# Patient Record
Sex: Male | Born: 1965 | Race: White | Hispanic: No | Marital: Single | Smoking: Current every day smoker
Health system: Southern US, Community
[De-identification: ages and names within clinical notes are randomized; demographics above are authoritative.]

## PROBLEM LIST (undated history)

## (undated) DIAGNOSIS — F329 Major depressive disorder, single episode, unspecified: Secondary | ICD-10-CM

## (undated) DIAGNOSIS — E109 Type 1 diabetes mellitus without complications: Secondary | ICD-10-CM

## (undated) DIAGNOSIS — E785 Hyperlipidemia, unspecified: Secondary | ICD-10-CM

## (undated) DIAGNOSIS — F32A Depression, unspecified: Secondary | ICD-10-CM

## (undated) HISTORY — DX: Hyperlipidemia, unspecified: E78.5

---

## 2005-07-30 ENCOUNTER — Ambulatory Visit (HOSPITAL_COMMUNITY): Admission: RE | Admit: 2005-07-30 | Discharge: 2005-07-30 | Payer: Self-pay | Admitting: Obstetrics and Gynecology

## 2006-06-24 ENCOUNTER — Emergency Department (HOSPITAL_COMMUNITY): Admission: EM | Admit: 2006-06-24 | Discharge: 2006-06-25 | Payer: Self-pay | Admitting: Emergency Medicine

## 2006-06-25 ENCOUNTER — Inpatient Hospital Stay (HOSPITAL_COMMUNITY): Admission: RE | Admit: 2006-06-25 | Discharge: 2006-07-01 | Payer: Self-pay | Admitting: Psychiatry

## 2006-06-25 ENCOUNTER — Ambulatory Visit: Payer: Self-pay | Admitting: Psychiatry

## 2006-09-22 ENCOUNTER — Emergency Department (HOSPITAL_COMMUNITY): Admission: EM | Admit: 2006-09-22 | Discharge: 2006-09-22 | Payer: Self-pay | Admitting: Emergency Medicine

## 2006-11-18 ENCOUNTER — Emergency Department (HOSPITAL_COMMUNITY): Admission: EM | Admit: 2006-11-18 | Discharge: 2006-11-18 | Payer: Self-pay | Admitting: Emergency Medicine

## 2007-02-06 ENCOUNTER — Emergency Department (HOSPITAL_COMMUNITY): Admission: EM | Admit: 2007-02-06 | Discharge: 2007-02-06 | Payer: Self-pay | Admitting: *Deleted

## 2007-09-25 ENCOUNTER — Emergency Department (HOSPITAL_COMMUNITY): Admission: EM | Admit: 2007-09-25 | Discharge: 2007-09-25 | Payer: Self-pay | Admitting: Emergency Medicine

## 2007-12-15 ENCOUNTER — Emergency Department (HOSPITAL_COMMUNITY): Admission: EM | Admit: 2007-12-15 | Discharge: 2007-12-15 | Payer: Self-pay | Admitting: Emergency Medicine

## 2008-03-16 ENCOUNTER — Emergency Department (HOSPITAL_COMMUNITY): Admission: EM | Admit: 2008-03-16 | Discharge: 2008-03-17 | Payer: Self-pay | Admitting: Emergency Medicine

## 2008-06-07 ENCOUNTER — Emergency Department (HOSPITAL_COMMUNITY): Admission: EM | Admit: 2008-06-07 | Discharge: 2008-06-07 | Payer: Self-pay | Admitting: Emergency Medicine

## 2008-07-04 ENCOUNTER — Emergency Department (HOSPITAL_COMMUNITY): Admission: EM | Admit: 2008-07-04 | Discharge: 2008-07-04 | Payer: Self-pay | Admitting: Emergency Medicine

## 2008-07-25 ENCOUNTER — Emergency Department (HOSPITAL_COMMUNITY): Admission: EM | Admit: 2008-07-25 | Discharge: 2008-07-25 | Payer: Self-pay | Admitting: Emergency Medicine

## 2008-09-06 ENCOUNTER — Emergency Department (HOSPITAL_COMMUNITY): Admission: EM | Admit: 2008-09-06 | Discharge: 2008-09-06 | Payer: Self-pay | Admitting: Emergency Medicine

## 2008-09-24 ENCOUNTER — Emergency Department (HOSPITAL_COMMUNITY): Admission: EM | Admit: 2008-09-24 | Discharge: 2008-09-24 | Payer: Self-pay | Admitting: Emergency Medicine

## 2008-12-01 ENCOUNTER — Emergency Department (HOSPITAL_COMMUNITY): Admission: EM | Admit: 2008-12-01 | Discharge: 2008-12-01 | Payer: Self-pay | Admitting: Emergency Medicine

## 2008-12-14 ENCOUNTER — Emergency Department (HOSPITAL_COMMUNITY): Admission: EM | Admit: 2008-12-14 | Discharge: 2008-12-14 | Payer: Self-pay | Admitting: Emergency Medicine

## 2008-12-31 ENCOUNTER — Emergency Department (HOSPITAL_COMMUNITY): Admission: EM | Admit: 2008-12-31 | Discharge: 2008-12-31 | Payer: Self-pay | Admitting: Cardiology

## 2009-01-14 ENCOUNTER — Emergency Department (HOSPITAL_COMMUNITY): Admission: EM | Admit: 2009-01-14 | Discharge: 2009-01-14 | Payer: Self-pay | Admitting: Family Medicine

## 2009-01-31 ENCOUNTER — Emergency Department (HOSPITAL_COMMUNITY): Admission: EM | Admit: 2009-01-31 | Discharge: 2009-01-31 | Payer: Self-pay | Admitting: Emergency Medicine

## 2009-02-12 ENCOUNTER — Emergency Department (HOSPITAL_COMMUNITY): Admission: EM | Admit: 2009-02-12 | Discharge: 2009-02-12 | Payer: Self-pay | Admitting: Emergency Medicine

## 2009-03-23 ENCOUNTER — Emergency Department (HOSPITAL_COMMUNITY): Admission: EM | Admit: 2009-03-23 | Discharge: 2009-03-23 | Payer: Self-pay | Admitting: Emergency Medicine

## 2009-05-27 ENCOUNTER — Ambulatory Visit: Payer: Self-pay | Admitting: Endocrinology

## 2009-07-16 ENCOUNTER — Emergency Department (HOSPITAL_COMMUNITY): Admission: EM | Admit: 2009-07-16 | Discharge: 2009-07-16 | Payer: Self-pay | Admitting: Emergency Medicine

## 2009-07-17 ENCOUNTER — Emergency Department (HOSPITAL_COMMUNITY): Admission: EM | Admit: 2009-07-17 | Discharge: 2009-07-17 | Payer: Self-pay | Admitting: Emergency Medicine

## 2009-09-11 ENCOUNTER — Emergency Department (HOSPITAL_COMMUNITY): Admission: EM | Admit: 2009-09-11 | Discharge: 2009-09-11 | Payer: Self-pay | Admitting: Emergency Medicine

## 2009-09-18 ENCOUNTER — Emergency Department (HOSPITAL_COMMUNITY): Admission: EM | Admit: 2009-09-18 | Discharge: 2009-09-18 | Payer: Self-pay | Admitting: Emergency Medicine

## 2009-11-09 ENCOUNTER — Emergency Department (HOSPITAL_COMMUNITY): Admission: EM | Admit: 2009-11-09 | Discharge: 2009-11-09 | Payer: Self-pay | Admitting: Emergency Medicine

## 2009-11-10 ENCOUNTER — Emergency Department (HOSPITAL_COMMUNITY): Admission: EM | Admit: 2009-11-10 | Discharge: 2009-11-11 | Payer: Self-pay | Admitting: Emergency Medicine

## 2010-03-30 ENCOUNTER — Emergency Department (HOSPITAL_COMMUNITY)
Admission: EM | Admit: 2010-03-30 | Discharge: 2010-03-30 | Payer: Self-pay | Source: Home / Self Care | Admitting: Family Medicine

## 2010-04-06 LAB — HIV ANTIBODY (ROUTINE TESTING W REFLEX): HIV: NONREACTIVE

## 2010-04-12 ENCOUNTER — Encounter: Payer: Self-pay | Admitting: Family Medicine

## 2010-05-26 ENCOUNTER — Emergency Department (HOSPITAL_COMMUNITY)
Admission: EM | Admit: 2010-05-26 | Discharge: 2010-05-26 | Disposition: A | Discharge status: Home / Self Care | Payer: Self-pay | Attending: Emergency Medicine | Admitting: Emergency Medicine

## 2010-05-26 DIAGNOSIS — M545 Low back pain, unspecified: Secondary | ICD-10-CM | POA: Insufficient documentation

## 2010-05-26 DIAGNOSIS — E119 Type 2 diabetes mellitus without complications: Secondary | ICD-10-CM | POA: Insufficient documentation

## 2010-05-26 DIAGNOSIS — Z794 Long term (current) use of insulin: Secondary | ICD-10-CM | POA: Insufficient documentation

## 2010-05-26 DIAGNOSIS — B49 Unspecified mycosis: Secondary | ICD-10-CM | POA: Insufficient documentation

## 2010-06-05 LAB — GLUCOSE, CAPILLARY: Glucose-Capillary: 165 mg/dL — ABNORMAL HIGH (ref 70–99)

## 2010-06-07 LAB — POCT RAPID STREP A (OFFICE): Streptococcus, Group A Screen (Direct): NEGATIVE

## 2010-06-07 LAB — GLUCOSE, CAPILLARY
Glucose-Capillary: 209 mg/dL — ABNORMAL HIGH (ref 70–99)
Glucose-Capillary: 192 mg/dL — ABNORMAL HIGH (ref 70–99)

## 2010-06-09 LAB — URINALYSIS, ROUTINE W REFLEX MICROSCOPIC
Bilirubin Urine: NEGATIVE
Glucose, UA: >1000 mg/dL — AB
Hgb urine dipstick: NEGATIVE
Ketones, ur: NEGATIVE mg/dL
Leukocytes, UA: NEGATIVE
Nitrite: NEGATIVE
Protein, ur: NEGATIVE mg/dL
Specific Gravity, Urine: 1.022 (ref 1.005–1.030)
Urobilinogen, UA: 0.2 mg/dL (ref 0.0–1.0)
pH: 6 (ref 5.0–8.0)

## 2010-06-09 LAB — URINE MICROSCOPIC-ADD ON

## 2010-06-09 LAB — GLUCOSE, CAPILLARY: Glucose-Capillary: 276 mg/dL — ABNORMAL HIGH (ref 70–99)

## 2010-06-17 ENCOUNTER — Emergency Department (HOSPITAL_COMMUNITY)
Admission: EM | Admit: 2010-06-17 | Discharge: 2010-06-18 | Disposition: A | Discharge status: Home / Self Care | Payer: Self-pay | Attending: Emergency Medicine | Admitting: Emergency Medicine

## 2010-06-17 DIAGNOSIS — M545 Low back pain, unspecified: Secondary | ICD-10-CM | POA: Insufficient documentation

## 2010-06-17 DIAGNOSIS — IMO0002 Reserved for concepts with insufficient information to code with codable children: Secondary | ICD-10-CM | POA: Insufficient documentation

## 2010-06-17 DIAGNOSIS — Z794 Long term (current) use of insulin: Secondary | ICD-10-CM | POA: Insufficient documentation

## 2010-06-17 DIAGNOSIS — F329 Major depressive disorder, single episode, unspecified: Secondary | ICD-10-CM | POA: Insufficient documentation

## 2010-06-17 DIAGNOSIS — G8929 Other chronic pain: Secondary | ICD-10-CM | POA: Insufficient documentation

## 2010-06-17 DIAGNOSIS — E109 Type 1 diabetes mellitus without complications: Secondary | ICD-10-CM | POA: Insufficient documentation

## 2010-06-17 DIAGNOSIS — F3289 Other specified depressive episodes: Secondary | ICD-10-CM | POA: Insufficient documentation

## 2010-06-26 LAB — GLUCOSE, CAPILLARY: Glucose-Capillary: 169 mg/dL — ABNORMAL HIGH (ref 70–99)

## 2010-06-28 LAB — URINALYSIS, ROUTINE W REFLEX MICROSCOPIC
Bilirubin Urine: NEGATIVE
Glucose, UA: >1000 mg/dL — AB
Hgb urine dipstick: NEGATIVE
Ketones, ur: NEGATIVE mg/dL
Leukocytes, UA: NEGATIVE
Nitrite: NEGATIVE
Protein, ur: NEGATIVE mg/dL
Specific Gravity, Urine: <1.005 — ABNORMAL LOW (ref 1.005–1.030)
Urobilinogen, UA: 0.2 mg/dL (ref 0.0–1.0)
pH: 5 (ref 5.0–8.0)

## 2010-06-28 LAB — BASIC METABOLIC PANEL
BUN: 9 mg/dL (ref 6–23)
CO2: 32 mEq/L (ref 19–32)
Calcium: 9.7 mg/dL (ref 8.4–10.5)
Chloride: 101 mEq/L (ref 96–112)
Creatinine, Ser: 0.81 mg/dL (ref 0.4–1.5)
GFR calc Af Amer: >60 mL/min (ref >60)
GFR calc non Af Amer: >60 mL/min (ref >60)
Glucose, Bld: 384 mg/dL — ABNORMAL HIGH (ref 70–99)
Potassium: 4.6 mEq/L (ref 3.5–5.1)
Sodium: 137 mEq/L (ref 135–145)

## 2010-06-28 LAB — CBC
HCT: 44 % (ref 39.0–52.0)
Hemoglobin: 15.9 g/dL (ref 13.0–17.0)
MCHC: 36.1 g/dL — ABNORMAL HIGH (ref 30.0–36.0)
MCV: 86.9 fL (ref 78.0–100.0)
Platelets: 183 10*3/uL (ref 150–400)
RBC: 5.06 MIL/uL (ref 4.22–5.81)
RDW: 12.2 % (ref 11.5–15.5)
WBC: 6 10*3/uL (ref 4.0–10.5)

## 2010-06-28 LAB — GLUCOSE, CAPILLARY
Glucose-Capillary: 280 mg/dL — ABNORMAL HIGH (ref 70–99)
Glucose-Capillary: 310 mg/dL — ABNORMAL HIGH (ref 70–99)
Glucose-Capillary: 322 mg/dL — ABNORMAL HIGH (ref 70–99)

## 2010-06-28 LAB — DIFFERENTIAL
Basophils Absolute: 0 10*3/uL (ref 0.0–0.1)
Basophils Relative: 0 % (ref 0–1)
Eosinophils Absolute: 0 10*3/uL (ref 0.0–0.7)
Eosinophils Relative: 0 % (ref 0–5)
Lymphocytes Relative: 33 % (ref 12–46)
Lymphs Abs: 2 10*3/uL (ref 0.7–4.0)
Monocytes Absolute: 0.4 10*3/uL (ref 0.1–1.0)
Monocytes Relative: 7 % (ref 3–12)
Neutro Abs: 3.6 10*3/uL (ref 1.7–7.7)
Neutrophils Relative %: 60 % (ref 43–77)

## 2010-06-28 LAB — URINE CULTURE: Colony Count: 1000

## 2010-06-28 LAB — URINE MICROSCOPIC-ADD ON: Urine-Other: NONE SEEN

## 2010-06-29 LAB — GLUCOSE, CAPILLARY: Glucose-Capillary: 364 mg/dL — ABNORMAL HIGH (ref 70–99)

## 2010-06-30 LAB — GLUCOSE, CAPILLARY: Glucose-Capillary: 127 mg/dL — ABNORMAL HIGH (ref 70–99)

## 2010-06-30 LAB — POCT I-STAT, CHEM 8
BUN: 14 mg/dL (ref 6–23)
Creatinine, Ser: 1 mg/dL (ref 0.4–1.5)
Hemoglobin: 16.3 g/dL (ref 13.0–17.0)
Potassium: 4.4 mEq/L (ref 3.5–5.1)
Sodium: 139 mEq/L (ref 135–145)
TCO2: 26 mmol/L (ref 0–100)

## 2010-06-30 LAB — CK TOTAL AND CKMB (NOT AT ARMC): CK, MB: 1 ng/mL (ref 0.3–4.0)

## 2010-07-02 LAB — GLUCOSE, CAPILLARY: Glucose-Capillary: 234 mg/dL — ABNORMAL HIGH (ref 70–99)

## 2010-08-04 NOTE — Consult Note (Signed)
NAMEELMO, RIO NO.:  [id number]   MEDICAL RECORD NO.:  [id number]          PATIENT TYPE:  EMS   LOCATION:  ED                            FACILITY:  [facility]   PHYSICIAN:  Osvaldo Shipper, MD     DATE OF BIRTH:  Aug 18, 1965   DATE OF CONSULTATION:  09/25/2007  DATE OF DISCHARGE:                                 CONSULTATION   The patient goes to the health department in [facility] for his  medical needs.  He also sees Dr. Betti Cruz, a psychiatrist.   REASON FOR CONSULTATION:  Abdominal pain.   PERSON REQUESTING CONSULTATION:  ED physician.   CHIEF COMPLAINT:  Abdominal pain since 5 p.m. yesterday.   HISTORY OF PRESENT ILLNESS:  The patient is a 45 year old Caucasian male  who has a past medical history of diabetes on insulin, depression,  anxiety disorder, fibromyalgia, who was in his usual state of health  until about 5 p.m. yesterday evening when he started experiencing  bilateral flank pain, and this started radiating to the front part of  his abdomen.  He had some nausea but did not have any emesis.  Denies  any diarrhea.  He had two bowel movements yesterday which were normal.  Denies any symptoms of acid reflux.  He states the pain is an 8/10 in  intensity.  He is moaning and groaning.  Denies any falls.  No dysuria.  No fever or chills.  No sick contacts.  No recent travel outside this  area.  The pain is described as a throbbing pain.  He denies any  surgeries in the past.   MEDICATIONS AT HOME:  1. Lantus insulin 30 units b.i.d.  2. NovoLog on a sliding scale.  3. Celexa 20 mg daily.  4. Klonopin 1 mg three times a day.   ALLERGIES:  No known drug allergies.   PAST MEDICAL HISTORY:  1. Anxiety disorder.  2. Depression.  3. Diabetes.  4. Fibromyalgia.  5. Neuropathy.   PAST SURGICAL HISTORY:  No surgical history.   No history of coronary artery disease.   SOCIAL HISTORY:  Lives with his sister.  Currently unemployed.  No  illicit drug  use.  No alcohol use.  Smokes a pack of cigarettes on a  daily basis.   FAMILY HISTORY:  Positive for diabetes, hypertension, dyslipidemia and  unknown cancer.   REVIEW OF SYSTEMS:  GENERAL:  Positive for weakness.  HEENT:  Unremarkable.  CARDIOVASCULAR:  Unremarkable. No Chest Pain.  RESPIRATORY:  Unremarkable. No difficulty with breathing. GI:  As in  HPI.  GU:  Unremarkable.  MUSCULOSKELETAL:  Unremarkable.  NEUROLOGIC:  Unremarkable.  PSYCHIATRIC:  Unremarkable.   PHYSICAL EXAMINATION:  VITAL SIGNS:  Temperature 97.8, blood pressure  125/76, heart rate 71, respiratory rate 18, saturation 100% on room air.  GENERAL:  A well-developed, well-nourished white male in no distress.  He appears to be moaning and groaning, but when he is talking, he is in  no discomfort whatsoever.  HEENT:  No pallor, no icterus.  Oral mucous membranes moist.  No oral  lesions.  NECK:  Soft and supple.  No thyromegaly is appreciated.  LUNGS:  Clear to auscultation bilaterally.  CARDIOVASCULAR:  S1 and S2 are normal, regular. No murmurs, s3 s4, rubs,  bruits.  ABDOMEN:  Soft, nontender, nondistended.  Bowel sounds are present.  No  organomegaly is appreciated.  No CVA tenderness is present.  No bruits  are heard.  No rebound tenderness is present anywhere.  There is mild  tenderness in the epigastric area, but, again, this is very minimal.  EXTREMITIES:  No edema.  Peripheral pulses are palpable.  NEUROLOGIC:  He is alert, oriented x3.  No focal neurological deficits  are present.   LABORATORY DATA:  CBC is unremarkable.  His CMET shows a glucose of 183;  otherwise completely normal.  Lipase is normal.  UA shows slightly  dilute urine with mild glucosuria.  No evidence of infection.  Hemoglobin is negative.   He had a CT scan of his abdomen and pelvis with contrast which did not  show any acute intra-abdominal process.  I also spoke to the  radiologist, and he mentioned that all the vasculature in the  abdomen  appeared to be patent.  No abdominal aortic aneurysm was noted.  Mild  bladder wall thickening was noted.  Incidentally, two 5 mm left lower  lobe pulmonary nodules were noted.   ASSESSMENT AND PLAN:  This is a 45 year old Caucasian male who has a  history of diabetes who presents with abdominal pain which began  yesterday evening.  His work-up has been completely unremarkable.  He  received multiple doses of Dilaudid, which, according to the patient,  have not caused him any relief from the pain.  We were called to  evaluate the patient.  I have reviewed the CT scan report and have also  reviewed the CT with the radiologist.  No concerning abnormalities  present.  The bladder wall thickening is non-specific.  With a negative  UA and with no symptom, I don't suspect a UTI.  The patient has been on  pain medications and methadone in the past, and according to him, these  have been discontinued ever since the primary medical doctor decided not  to prescribe it to him.  I am not sure if this is pain medication  seeking behavior.  At this point, I do not see any reason to admit this  patient to the hospital.  I will prescribe him 10 tablets of Percocet, a  4-week course of Prilosec.  Will provide him with a phone number for  gastroenterology if this persists.  He needs to have a soft, bland diet  for 5 days.  He needs to stop smoking.  He has also been told about his  lung findings, and I have explained to him that he needs to get a  CT scan in 6 months time to evaluate his pulmonary nodules.  He may  continue his other home medications at home as before.  If his pain does  not improve in the next few day, he may return to the ED for further  evaluation.  The above was also communicated to the ED physician, Dr.  Margretta Ditty.      Osvaldo Shipper, MD  Electronically Signed     GK/MEDQ  D:  09/25/2007  T:  09/25/2007  Job:  [id number]   cc:   [facility]

## 2010-08-07 NOTE — Discharge Summary (Signed)
NAMEADRIENNE, DELAY NO.:  [id number]   MEDICAL RECORD NO.:  [id number]          PATIENT TYPE:  IPS   LOCATION:  0500                          FACILITY:  [facility]   PHYSICIAN:  Geoffery Lyons, M.D.      DATE OF BIRTH:  08/26/1965   DATE OF ADMISSION:  06/25/2006  DATE OF DISCHARGE:  07/01/2006                               DISCHARGE SUMMARY   CHIEF COMPLAINT AND PRESENT ILLNESS:  This was the first admission to  [facility] for this 45 year old single white male.  Presented to the emergency room at [facility].  Endorsed he has been  depressed, wakes up every morning wanting to kill himself, lost his job  in January, having crying spells, difficulty concentrating, memory loss.  At the time of this admission, he felt suicidal, had a plan to jump off  a bridge, unable to pay to see a doctor to get his medications.  No  energy, no initiative, neglecting his pet dog, feels like he is in a rut  and just cannot get out.  He has been employed as an Personnel officer.  He  developed MRSA.  Due to the treatment, he claimed he did not feel good  and eventually lost his job.  No income at all.  Thought that he could  get a job easily.   PAST PSYCHIATRIC HISTORY:  He was at [facility] due to substance  abuse.   MEDICAL HISTORY:  Diabetes, fibromyalgia, hyperlipidemia, peripheral  neuropathy and MRSA.   MEDICATIONS:  Methadone 100 mg per day, Xanax 1 mg, 1-2 every day,  buying off the street, Klonopin prescribed, using it to help sleep the  last two years, insulin 70/30.   PHYSICAL EXAMINATION:  Performed and failed to show any acute findings.   LABORATORY DATA:  SGOT 15, SGPT 27, glucose 305.  UDS positive for  benzodiazepine.   MENTAL STATUS EXAM:  Alert, cooperative male casually groomed and  dressed.  Speech is normal in rate, tempo and production.  Mood is  depressed.  Affect is congruent.  Thought processes are clear, rational  and goal-oriented.  Would  not want to get on any medication that he  could not keep upon discharge.  Has no resources.  Denies any suicidal  or homicidal ideation.  No evidence of delusions.  Cognition well-  preserved.   ADMISSION DIAGNOSES:  AXIS I:  Major depressive disorder.  AXIS II:  No diagnosis.  AXIS III:  Fibromyalgia, diabetes mellitus, hyperlipidemia, peripheral  neuropathy, MRSA, by history.  AXIS IV:  Moderate.  AXIS V:  GAF upon admission 25-30; highest GAF in the last year 60.   HOSPITAL COURSE:  He was admitted and started in individual and group  psychotherapy.  He was detoxed with Librium as he was taken off the  benzodiazepines.  He was placed on Cymbalta 30 mg per day and kept on  the methadone.  His father died in 2003-08-03.  This was very stressful for  him.  Endorsed that, about a year ago, sleep changed but using Klonopin.  Before that, used to be  outgoing, then he started to be more to himself,  lost his job due to MRSA, abscess in his neck, as already stated, goes  to bed at 2 in the morning, cannot sleep, feeling terrible.  Has  diabetes.  Got to a point that he wanted to hurt himself.  Had been in  [place] six years ago.  Addicted to Xanax.  Has been on Lexapro and then  switched to Prozac.  Was on OxyContin for fibromyalgia and he was  switched to methadone for withdrawal.  By June 28, 2006, he was dealing  with anxiety and also the depression.  Mood worse in the last four  months.  Did the best on Lexapro, __________.  Using benzodiazepines on  his own, high doses of opiates.  Claimed he was suicidal on Wellbutrin.  No energy, no motivation, no desire to do things.  Did say cocaine made  him feel well and not high, just well.  Says he wanted to quit self-  medicating.  Wanting to do better.  Was on Effexor with side effects,  Cymbalta with no benefit.  Agreed to try Paxil and says things  differently.  Committed to abstinence to do the right thing.  Worried  about finding a job but did  admit that, if he abstained, he is going to  be more clear-headed.  We pursued the Paxil, coping skills, relapse  prevention.  As we detoxed, became pretty anxious as Librium was  discontinued.  A lot of uncertainty, worries, concerns.  Did not sleep  due to worrying.  By July 01, 2006, he was not suicidal.  He was  worried about relapse risk but had a positive plan of action.  Was going  to pursue recovery.  There were no active suicidal or homicidal  ideation, no hallucinations, no delusions.  He was insightful.  He was  going to continued to work with himself, seek counseling and maintain  the medication.   DISCHARGE DIAGNOSES:  AXIS I:  Major depressive disorder.  Anxiety  disorder not otherwise specified.  Benzodiazepine, opiate and cocaine  abuse.  AXIS II:  No diagnosis.  AXIS III:  Fibromyalgia, diabetes, hyperlipidemia, peripheral  neuropathy, history of MRSA.  AXIS IV:  Moderate.  AXIS V:  GAF upon discharge 50-55.   DISCHARGE MEDICATIONS:  1. Insulin NovoLog mix 70/30 40 units in the morning.  2. Methadone 10 mg per day.  3. Paxil CR 25 mg per day.  4. Trazodone 50 mg at bedtime.   FOLLOWUP:  Dr. Sudie Bailey in [facility].      Geoffery Lyons, M.D.  Electronically Signed     IL/MEDQ  D:  07/28/2006  T:  07/29/2006  Job:  [id number]

## 2010-08-07 NOTE — H&P (Signed)
Justin Moreno, Justin Moreno NO.:  [id number]   MEDICAL RECORD NO.:  [id number]          PATIENT TYPE:  IPS   LOCATION:  0500                          FACILITY:  [facility]   PHYSICIAN:  Justin Moreno, M.D.      DATE OF BIRTH:  January 04, 1966   DATE OF ADMISSION:  06/25/2006  DATE OF DISCHARGE:                       PSYCHIATRIC ADMISSION ASSESSMENT   IDENTIFYING INFORMATION:  This is a voluntary admission to the services  of Justin Moreno.  This is a 45 year old single white male.  He  presented to the ED at [facility].  He reported that he has been  depressed.  He wakes up every morning wanting to kill himself.  He lost  his job in January.  He notices that he is having crying spells,  difficulty concentrating, memory loss.  Today, he felt suicidal.  He  actually had a plan to jump off a bridge.  He is unable to pay to see a  doctor to get his medications.  He has no energy, no initiative.  He is  neglecting his pet dog.  He feels like he is in a rut and he just cannot  get out.  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  Back in November, it developed that these boils that kept  coming up on him actually were MRSA.  Due to medication that he had to  take, he just did not feel good.  He eventually lost his job in January.  He only recently has applied for unemployment.  He has no income at all  and he kept thinking he would get another job.  He is also treated for  fibromyalgia.   PAST PSYCHIATRIC HISTORY:  In 1990, he was at [facility] for substance  abuse.   SOCIAL HISTORY:  He is a high Garment/textile technologist in 1985.  He is never  married.  He is employed as an Personnel officer.  He does have a girlfriend  and currently he has been unemployed with no income since January.   FAMILY HISTORY:  His mom, sisters and brother all have anxiety.   ALCOHOL/DRUG HISTORY:  He continues to smoke 1-1/2 to 2 packs of  cigarettes per day.  He has been clean and sober from  drugs and alcohol  for about five years.   PRIMARY CARE PHYSICIAN:  Justin Moreno.   MEDICAL PROBLEMS:  He has diabetes, fibromyalgia, hyperlipidemia,  peripheral neuropathy and MRSA.  He apparently had positive cultures  from his right posterior arm and left clavicle.  However, they are not  active at this time.   MEDICATIONS:  He is prescribed methadone 10 mg p.o. q.d., Xanax 1 mg, 1-  2 q.d., that he has been buying off the street.  He had been prescribed  Klonopin by Justin Moreno and he has been using that to help him go to  sleep for the past two years.  Prior to losing his job, his diabetes was  well-controlled with Lantus and NovoLog.  He is now using insulin 70/30.  I do not have the exact dosages.  ALLERGIES:  No known drug allergies.   POSITIVE PHYSICAL FINDINGS:  His exam was completed at [facility] and he  had no remarkable findings.   LABORATORY DATA:  His glucose was 305.  His UDS was positive for  benzodiazepines and his alcohol level was 1.   MENTAL STATUS EXAM:  Today, he is alert and oriented x3.  He is casually  but appropriately groomed, dressed.  He does not appear to have been  malnourished.  His speech is not pressured.  His mood is depressed.  His  affect is congruent.  Thought processes are clear, rational and goal-  oriented.  He does not want to get on anything that he will not be able  to continue once discharged.  Judgment and insight are intact.  Concentration and memory are intact.  Intelligence is at least average.  He denies being suicidal or homicidal.  He denies auditory or visual  hallucinations.   DIAGNOSES:  AXIS I:  Major depressive disorder.  AXIS II:  Deferred.  AXIS III:  Fibromyalgia, diabetes, hyperlipidemia, peripheral neuropathy  and history for MRSA.  AXIS IV:  Severe.  AXIS V:  20.   PLAN:  To admit for safety and stabilization.  He was put on the low-  dose Librium protocol for detoxification purposes.  However, his  methadone  10 mg will be continued.  He is also on the sliding scale  insulin for glycemic control.  He is on the moderate protocol and we  will be starting some Cymbalta.  He states he has taken this in the past  but I feel about the medication he will be able to continue once  discharged.      Justin Moreno, P.A.-C.      Justin Moreno, M.D.  Electronically Signed    MD/MEDQ  D:  06/26/2006  T:  06/26/2006  Job:  [id number]

## 2010-10-29 ENCOUNTER — Encounter: Payer: Self-pay | Admitting: *Deleted

## 2010-10-29 ENCOUNTER — Emergency Department (HOSPITAL_COMMUNITY)
Admission: EM | Admit: 2010-10-29 | Discharge: 2010-10-30 | Disposition: A | Discharge status: Home / Self Care | Payer: Self-pay | Attending: Emergency Medicine | Admitting: Emergency Medicine

## 2010-10-29 DIAGNOSIS — M549 Dorsalgia, unspecified: Secondary | ICD-10-CM | POA: Insufficient documentation

## 2010-10-29 DIAGNOSIS — R5381 Other malaise: Secondary | ICD-10-CM | POA: Insufficient documentation

## 2010-10-29 DIAGNOSIS — E119 Type 2 diabetes mellitus without complications: Secondary | ICD-10-CM | POA: Insufficient documentation

## 2010-10-29 DIAGNOSIS — R109 Unspecified abdominal pain: Secondary | ICD-10-CM | POA: Insufficient documentation

## 2010-10-29 DIAGNOSIS — F17200 Nicotine dependence, unspecified, uncomplicated: Secondary | ICD-10-CM | POA: Insufficient documentation

## 2010-10-29 HISTORY — DX: Type 1 diabetes mellitus without complications: E10.9

## 2010-10-29 NOTE — ED Notes (Signed)
Pt c/o lower back pain and bilateral abd pain x 3 days; pt states he has been nauseous, denies any v/d

## 2010-10-30 ENCOUNTER — Emergency Department (HOSPITAL_COMMUNITY): Payer: Self-pay

## 2010-10-30 LAB — POCT I-STAT, CHEM 8
Creatinine, Ser: 1 mg/dL (ref 0.50–1.35)
Hemoglobin: 15.3 g/dL (ref 13.0–17.0)
Potassium: 3.9 mEq/L (ref 3.5–5.1)
Sodium: 142 mEq/L (ref 135–145)
TCO2: 30 mmol/L (ref 0–100)

## 2010-10-30 LAB — GLUCOSE, CAPILLARY: Glucose-Capillary: 139 mg/dL — ABNORMAL HIGH (ref 70–99)

## 2010-10-30 MED ORDER — ONDANSETRON HCL 4 MG PO TABS
4.0000 mg | ORAL_TABLET | Freq: Once | ORAL | Status: AC
  Administered 2010-10-30: 4 mg via ORAL
  Filled 2010-10-30: qty 1

## 2010-10-30 MED ORDER — HYDROCODONE-ACETAMINOPHEN 5-325 MG PO TABS
2.0000 | ORAL_TABLET | Freq: Once | ORAL | Status: AC
  Administered 2010-10-30: 2 via ORAL
  Filled 2010-10-30: qty 2

## 2010-10-30 MED ORDER — IBUPROFEN 800 MG PO TABS
800.0000 mg | ORAL_TABLET | Freq: Once | ORAL | Status: AC
  Administered 2010-10-30: 800 mg via ORAL
  Filled 2010-10-30: qty 1

## 2010-10-30 MED ORDER — CYCLOBENZAPRINE HCL 10 MG PO TABS: 10.0000 mg | ORAL_TABLET | Freq: Two times a day (BID) | ORAL | Status: AC | PRN

## 2010-10-30 MED ORDER — IBUPROFEN 800 MG PO TABS: 800.0000 mg | ORAL_TABLET | Freq: Three times a day (TID) | ORAL | Status: AC

## 2010-10-30 MED ORDER — HYDROCODONE-ACETAMINOPHEN 5-325 MG PO TABS: 1.0000 | ORAL_TABLET | ORAL | Status: AC | PRN

## 2010-10-30 MED ORDER — CYCLOBENZAPRINE HCL 10 MG PO TABS
10.0000 mg | ORAL_TABLET | Freq: Once | ORAL | Status: AC
  Administered 2010-10-30: 10 mg via ORAL
  Filled 2010-10-30: qty 1

## 2010-10-30 NOTE — ED Notes (Signed)
Patient given a sandwich and soda for blood sugar of 69; patient c/o feeling weak and shaky.  Dr. Colon Branch notified and ok to have food.

## 2010-10-30 NOTE — ED Notes (Signed)
Discharge instructions given and reviewed with patient.  Prescriptions given for Hydrocodone, Flexeril, and Ibuprofen; effects and use explained.  Patient verbalized understanding of sedating effects of medication.  Patient ambulatory with steady gait.  Discharged home in good condition.

## 2010-10-30 NOTE — ED Provider Notes (Signed)
History     CSN: [id number] Arrival date & time: 10/29/2010  9:29 PM  Chief Complaint  Patient presents with  . Back Pain  . Abdominal Pain   HPI Comments: Seen 2330  Patient is a 45 y.o. male presenting with back pain and abdominal pain. The history is provided by the patient.  Back Pain  This is a recurrent problem. Episode onset: Patient injured his back 3 months ago and it has hurt intermittantly since then. In the last two weeks pain to the lower back has gotten worse with some radiation bilaterally down the legs. The pain is associated with lifting heavy objects. The pain is present in the lumbar spine. The quality of the pain is described as aching. The pain radiates to the left thigh and right thigh. The pain is moderate. The symptoms are aggravated by bending and certain positions. The pain is worse during the night. Associated symptoms include abdominal pain and weakness. Pertinent negatives include no chest pain, no fever, no numbness, no weight loss, no headaches, no bladder incontinence, no dysuria, no pelvic pain, no paresthesias and no tingling. He has tried NSAIDs for the symptoms. The treatment provided no relief.  Abdominal Pain The primary symptoms of the illness include abdominal pain. The primary symptoms of the illness do not include fever or dysuria.  Additional symptoms associated with the illness include back pain.    Past Medical History  Diagnosis Date  . Juvenile diabetes     x 30 years    History reviewed. No pertinent past surgical history.  History reviewed. No pertinent family history.  History  Substance Use Topics  . Smoking status: Current Everyday Smoker -- 1.0 packs/day  . Smokeless tobacco: Not on file  . Alcohol Use:       Review of Systems  Constitutional: Negative for fever and weight loss.  Cardiovascular: Negative for chest pain.  Gastrointestinal: Positive for abdominal pain.  Genitourinary: Negative for bladder incontinence,  dysuria and pelvic pain.  Musculoskeletal: Positive for back pain.  Neurological: Positive for weakness. Negative for tingling, numbness, headaches and paresthesias.  All other systems reviewed and are negative.    Physical Exam  BP 142/75  Pulse 72  Temp(Src) 98 F (36.7 C) (Oral)  Resp 18  Ht 6\' 1"  (1.854 m)  Wt 220 lb (99.791 kg)  BMI 29.03 kg/m2  SpO2 97%  Physical Exam  Nursing note and vitals reviewed. Constitutional: He appears well-developed and well-nourished. No distress.  HENT:  Head: Normocephalic and atraumatic.  Eyes: EOM are normal.  Neck: Normal range of motion. Neck supple.  Cardiovascular: Normal rate and normal heart sounds.   Pulmonary/Chest: Effort normal and breath sounds normal.  Abdominal: Soft. Bowel sounds are normal. He exhibits no distension. There is no tenderness. There is no rebound and no guarding.  Musculoskeletal: Normal range of motion.       No spinal tenderness. Lumbar paraspinal  Tenderness to palpation. Able to bend over and touch toes, Lateral movement caused increased discomfort.  Skin: Skin is warm and dry.    ED Course  Procedures  MDM Reviewed xray reports. Istat results with glucose of 69. Fed patient snack and drink. Improvement in glucose to 136. Analgesic helped minimally. Will send home with Rx analgesic, muscle relaxant, antiinflammatory and referral to orthopedist, Dr. Hilda Lias.  MDM Interpretation: labs and x-ray   Reviewed nurse notes and vital signs.    Nicoletta Dress. Colon Branch, MD 10/30/10 [phone]

## 2010-10-30 NOTE — ED Notes (Signed)
Pt self ambulated with a steady and purposeful gait out side and back to smoke no noted distress

## 2010-10-30 NOTE — ED Notes (Signed)
CBG completed after patient ate sandwich; result of 139.  Dr. Colon Branch at bedside to discuss results and disposition with patient.

## 2010-12-17 LAB — COMPREHENSIVE METABOLIC PANEL
ALT: 39
AST: 21
Albumin: 3.7
Alkaline Phosphatase: 79
BUN: 8
Chloride: 99
GFR calc Af Amer: >60
Potassium: 4.4
Sodium: 136
Total Protein: 6.3

## 2010-12-17 LAB — URINALYSIS, ROUTINE W REFLEX MICROSCOPIC
Ketones, ur: NEGATIVE
Nitrite: NEGATIVE
Protein, ur: NEGATIVE
Urobilinogen, UA: 0.2

## 2010-12-17 LAB — DIFFERENTIAL
Basophils Relative: 1
Eosinophils Relative: 0
Monocytes Absolute: 0.3
Monocytes Relative: 4
Neutro Abs: 6.3

## 2010-12-17 LAB — CBC
Platelets: 227
RDW: 13.7
WBC: 7.9

## 2010-12-21 LAB — BASIC METABOLIC PANEL
BUN: 8
CO2: 27
Chloride: 98
Potassium: 4.2

## 2010-12-21 LAB — RAPID URINE DRUG SCREEN, HOSP PERFORMED
Amphetamines: NOT DETECTED
Barbiturates: NOT DETECTED

## 2010-12-21 LAB — SALICYLATE LEVEL: Salicylate Lvl: <4

## 2010-12-21 LAB — ETHANOL: Alcohol, Ethyl (B): <5

## 2011-01-05 LAB — BASIC METABOLIC PANEL
CO2: 29
Calcium: 9.6
GFR calc Af Amer: >60
GFR calc non Af Amer: >60
Sodium: 136

## 2011-01-05 LAB — RAPID URINE DRUG SCREEN, HOSP PERFORMED
Amphetamines: NOT DETECTED
Benzodiazepines: POSITIVE — AB
Cocaine: NOT DETECTED
Opiates: POSITIVE — AB
Tetrahydrocannabinol: NOT DETECTED

## 2011-01-05 LAB — CBC
Hemoglobin: 16.3
MCHC: 35.2
RBC: 5.47
WBC: 11 — ABNORMAL HIGH

## 2011-01-05 LAB — DIFFERENTIAL
Lymphocytes Relative: 30
Lymphs Abs: 3.3
Monocytes Absolute: 0.6
Monocytes Relative: 5
Neutro Abs: 7.1

## 2011-01-05 LAB — ETHANOL: Alcohol, Ethyl (B): <5

## 2011-01-31 IMAGING — CT CT ABDOMEN W/O CM
2 of 4 series · 16 of 46 positions shown, 18 images · IV contrast (agent unspecified)
Comparison: 09/25/2007

CT ABDOMEN:

CLINICAL DATA: Bilateral flank pain, urinary frequency, urinary
retention, diabetes

CT ABDOMEN AND PELVIS WITHOUT CONTRAST:
TECHNIQUE: Multidetector helical CT imaging of the abdomen and
pelvis were performed using kidney stone protocol.  Neither oral
nor intravenous contrast utilized for this indication.  Sagittal
and coronal MPR images reconstructed from axial data set.

[Series 2: stone under 45 under 200 wo 5.0m · axial · 0.71mm/px · z∈[+610,+1045]mm · 13 of 99 slices shown, 15 images]
[im 8/99  soft-tissue]
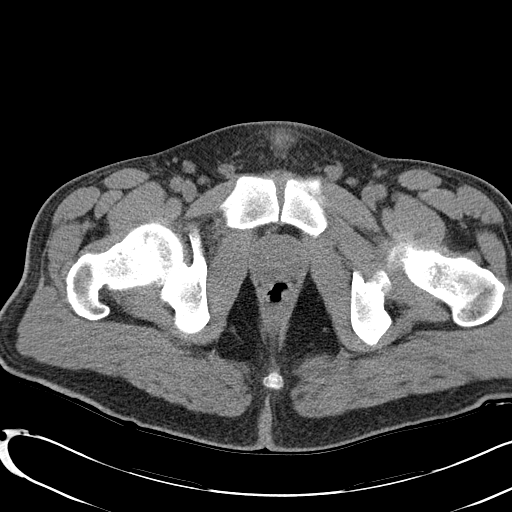
[im 8/99  bone]
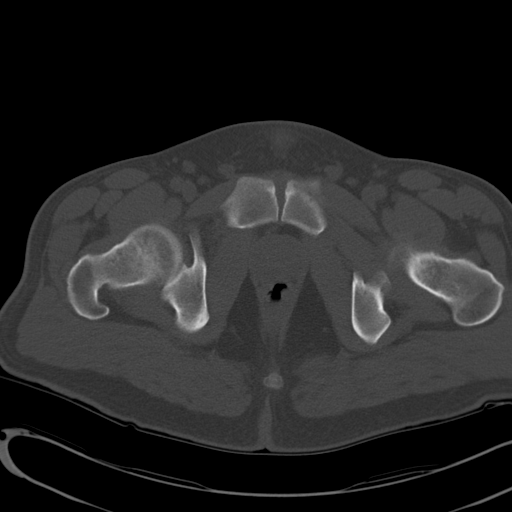
[im 15/99  soft-tissue]
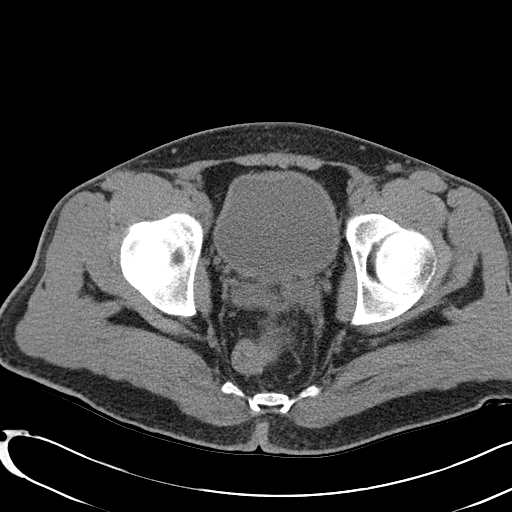
[im 22/99  soft-tissue]
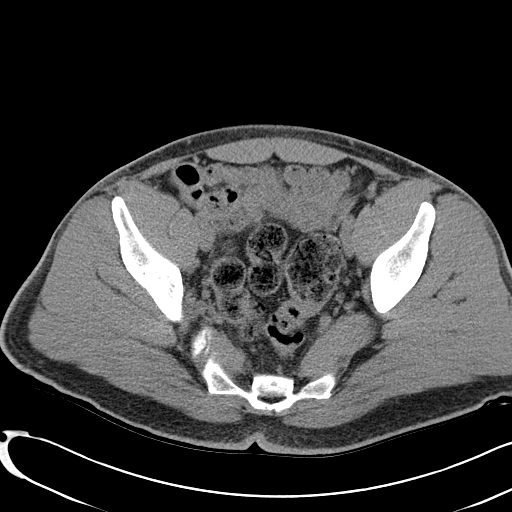
[im 30/99  soft-tissue]
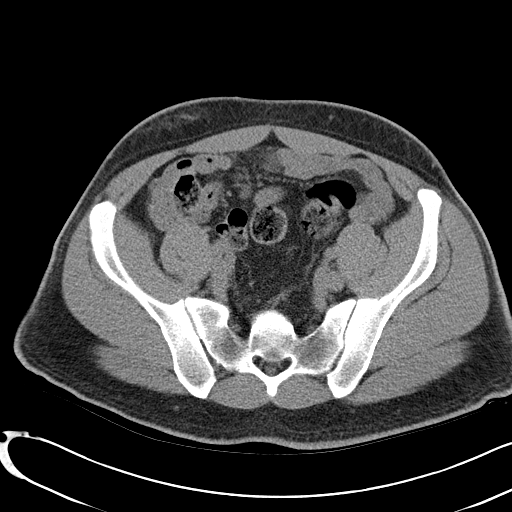
[im 37/99  soft-tissue]
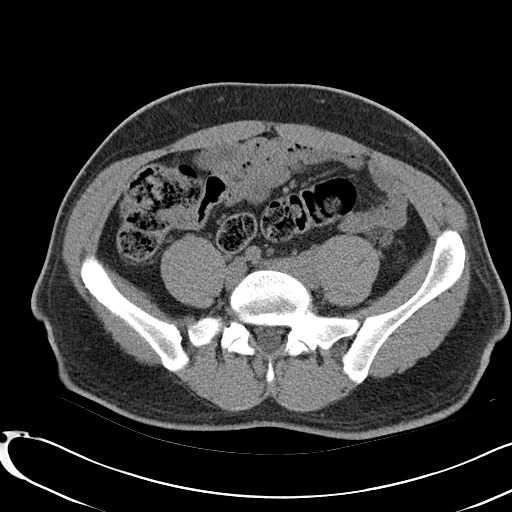
[im 44/99  soft-tissue]
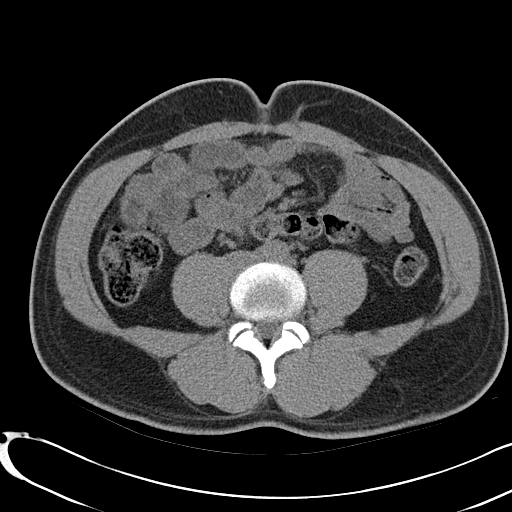
[im 51/99  soft-tissue]
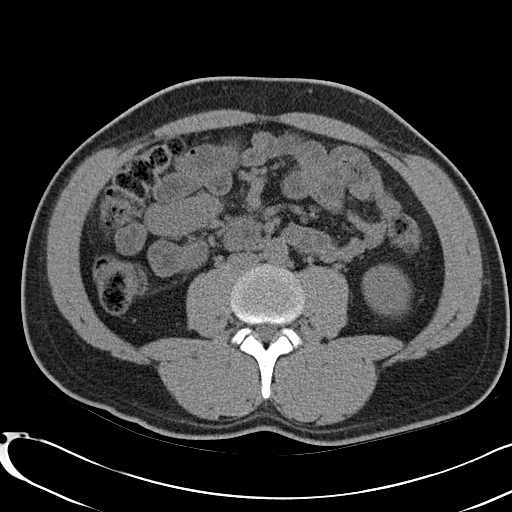
[im 59/99  soft-tissue]
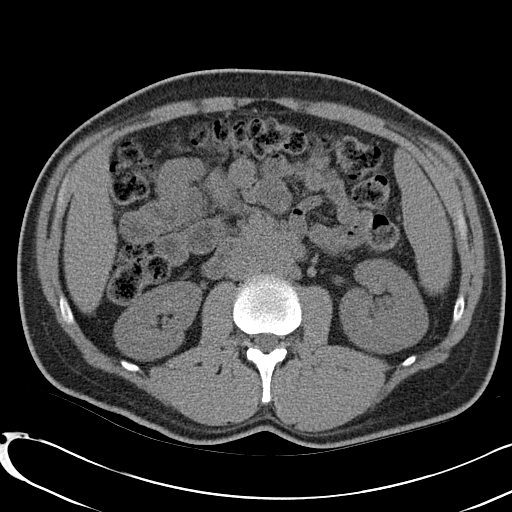
[im 66/99  soft-tissue]
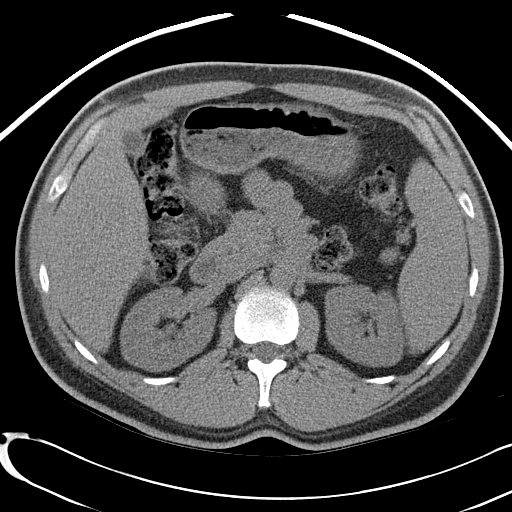
[im 66/99  bone]
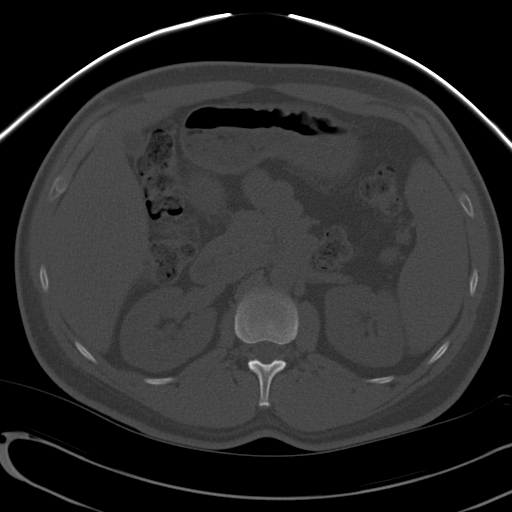
[im 73/99  soft-tissue]
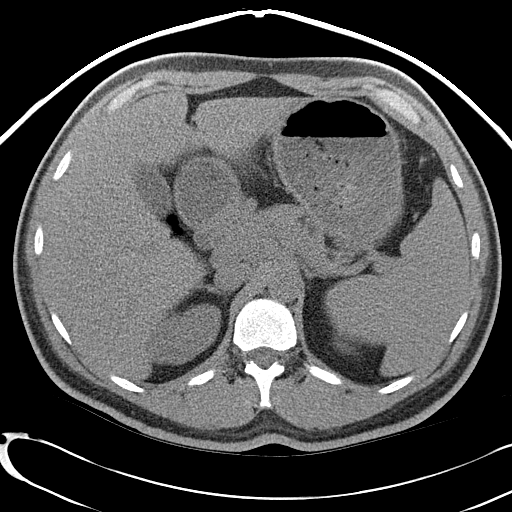
[im 80/99  soft-tissue]
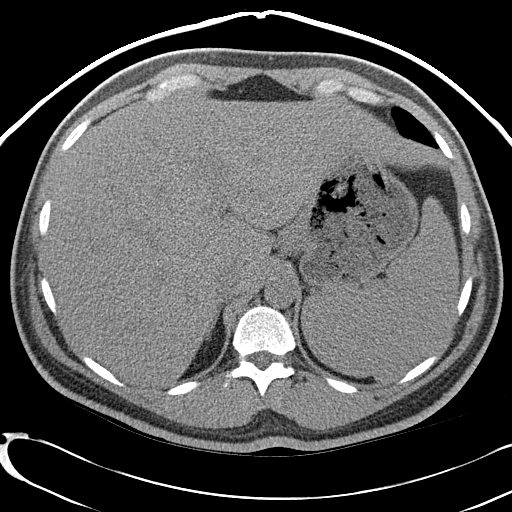
[im 88/99  soft-tissue]
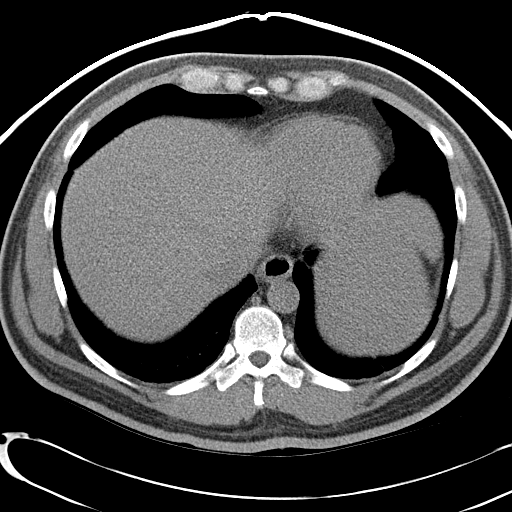
[im 95/99  soft-tissue]
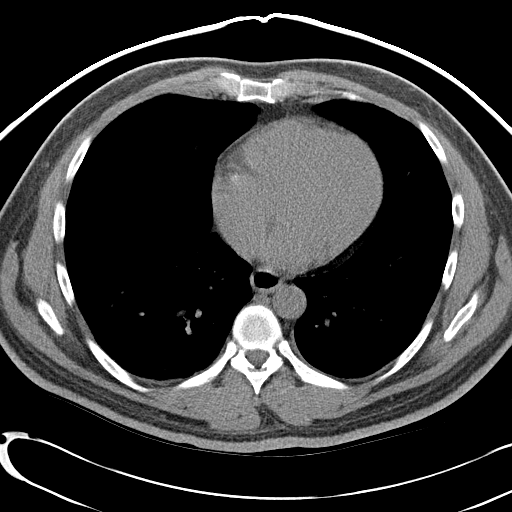

[Series 4: mpr coronal (id) · coronal · 0.75mm/px · 3 of 91 slices shown]
[im 31/91  soft-tissue]
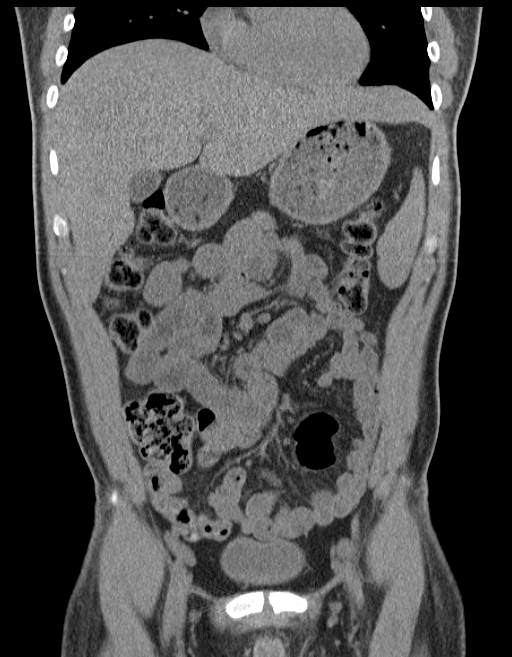
[im 41/91  soft-tissue]
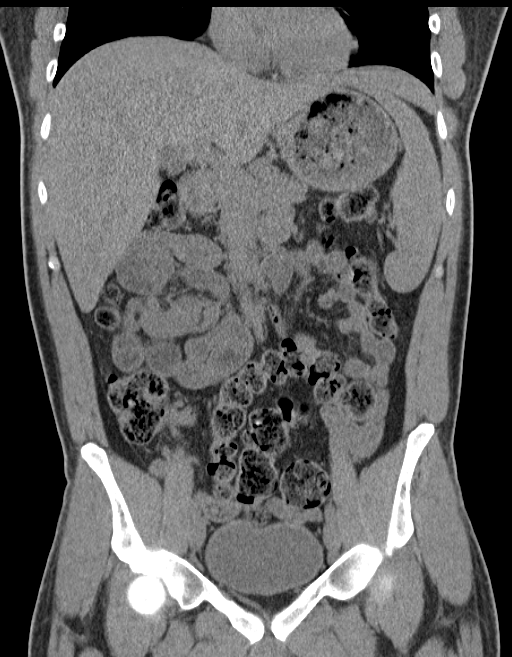
[im 51/91  soft-tissue]
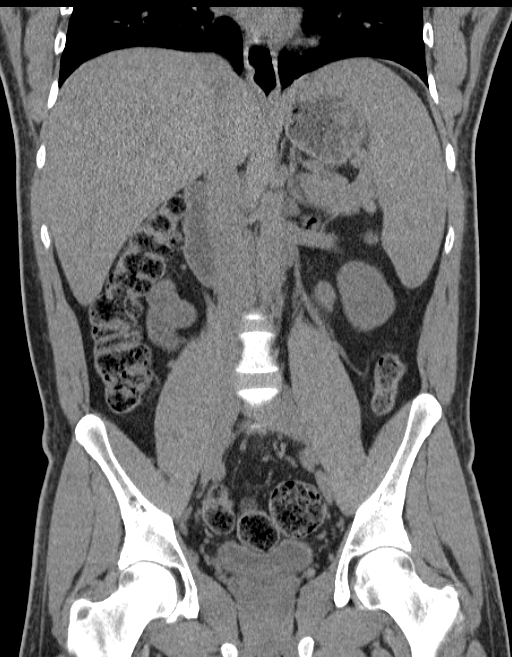

[16 of 46 positions shown; findings below may reference images not displayed]

FINDINGS: Dependent atelectasis at lung bases.
Tiny nonspecific low attenuation focus lateral aspect dome right
lobe liver, 9 mm image 10 stable.
Low attenuation lesion mid left kidney, poorly defined but
corresponding to a cyst present on prior CT.
No urinary tract calcification dilatation.
Solid organs and bowel loops in upper abdomen unremarkable for exam
lacking IV and oral contrast.
Probable splenule at splenic hilum, stable.
No mass, adenopathy, or free fluid.
IMPRESSION: Probable left renal cyst.
No acute upper abdominal abnormalities.

CT PELVIS:
FINDINGS: Pelvic bowel loops grossly unremarkable.
Bladder and distal ureters normal appearance.
No pelvic mass, adenopathy, or free fluid.
Bones unremarkable.
IMPRESSION: No acute intrapelvic abnormalities.

## 2011-08-01 ENCOUNTER — Encounter (HOSPITAL_COMMUNITY): Payer: Self-pay | Admitting: *Deleted

## 2011-08-01 ENCOUNTER — Emergency Department (HOSPITAL_COMMUNITY)
Admission: EM | Admit: 2011-08-01 | Discharge: 2011-08-02 | Disposition: A | Discharge status: Home / Self Care | Payer: Self-pay | Attending: Emergency Medicine | Admitting: Emergency Medicine

## 2011-08-01 DIAGNOSIS — M25559 Pain in unspecified hip: Secondary | ICD-10-CM | POA: Insufficient documentation

## 2011-08-01 DIAGNOSIS — R079 Chest pain, unspecified: Secondary | ICD-10-CM | POA: Insufficient documentation

## 2011-08-01 DIAGNOSIS — R1013 Epigastric pain: Secondary | ICD-10-CM | POA: Insufficient documentation

## 2011-08-01 DIAGNOSIS — R109 Unspecified abdominal pain: Secondary | ICD-10-CM | POA: Insufficient documentation

## 2011-08-01 DIAGNOSIS — M549 Dorsalgia, unspecified: Secondary | ICD-10-CM | POA: Insufficient documentation

## 2011-08-01 LAB — CBC
MCHC: 35.4 g/dL (ref 30.0–36.0)
Platelets: 177 10*3/uL (ref 150–400)
RDW: 13 % (ref 11.5–15.5)
WBC: 8.3 10*3/uL (ref 4.0–10.5)

## 2011-08-01 LAB — COMPREHENSIVE METABOLIC PANEL
ALT: 40 U/L (ref 0–53)
AST: 23 U/L (ref 0–37)
Albumin: 4.2 g/dL (ref 3.5–5.2)
CO2: 26 mEq/L (ref 19–32)
Calcium: 9.9 mg/dL (ref 8.4–10.5)
Chloride: 104 mEq/L (ref 96–112)
Creatinine, Ser: 0.76 mg/dL (ref 0.50–1.35)
GFR calc non Af Amer: >90 mL/min (ref >90)
Sodium: 142 mEq/L (ref 135–145)

## 2011-08-01 LAB — DIFFERENTIAL
Basophils Absolute: 0 10*3/uL (ref 0.0–0.1)
Basophils Relative: 0 % (ref 0–1)
Lymphocytes Relative: 32 % (ref 12–46)
Neutro Abs: 5.2 10*3/uL (ref 1.7–7.7)

## 2011-08-01 MED ORDER — OXYCODONE-ACETAMINOPHEN 5-325 MG PO TABS
2.0000 | ORAL_TABLET | Freq: Once | ORAL | Status: AC
  Administered 2011-08-01: 2 via ORAL
  Filled 2011-08-01: qty 2

## 2011-08-01 NOTE — ED Notes (Signed)
Pt reports left sided flank/lower back pain radiating around to llq starting a week ago and worsening tonight
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
## 2011-08-01 NOTE — ED Notes (Signed)
Pt up and walking in room, unable to sit or lay down.

## 2011-08-02 ENCOUNTER — Emergency Department (HOSPITAL_COMMUNITY): Payer: Self-pay

## 2011-08-02 LAB — URINALYSIS, ROUTINE W REFLEX MICROSCOPIC
Glucose, UA: NEGATIVE mg/dL
Hgb urine dipstick: NEGATIVE
Leukocytes, UA: NEGATIVE
Protein, ur: NEGATIVE mg/dL
pH: 6 (ref 5.0–8.0)

## 2011-08-02 MED ORDER — HYDROCODONE-ACETAMINOPHEN 5-325 MG PO TABS: 2.0000 | ORAL_TABLET | ORAL | Status: AC | PRN

## 2011-08-02 MED ORDER — IBUPROFEN 800 MG PO TABS: 800.0000 mg | ORAL_TABLET | Freq: Three times a day (TID) | ORAL | Status: AC

## 2011-08-02 NOTE — Discharge Instructions (Signed)
Back Pain, Adult Low back pain is very common. About 1 in 5 people have back pain.The cause of low back pain is rarely dangerous. The pain often gets better over time.About half of people with a sudden onset of back pain feel better in just 2 weeks. About 8 in 10 people feel better by 6 weeks.  CAUSES Some common causes of back pain include:  Strain of the muscles or ligaments supporting the spine.   Wear and tear (degeneration) of the spinal discs.   Arthritis.   Direct injury to the back.  DIAGNOSIS Most of the time, the direct cause of low back pain is not known.However, back pain can be treated effectively even when the exact cause of the pain is unknown.Answering your caregiver's questions about your overall health and symptoms is one of the most accurate ways to make sure the cause of your pain is not dangerous. If your caregiver needs more information, he or she may order lab work or imaging tests (X-rays or MRIs).However, even if imaging tests show changes in your back, this usually does not require surgery. HOME CARE INSTRUCTIONS For many people, back pain returns.Since low back pain is rarely dangerous, it is often a condition that people can learn to manageon their own.   Remain active. It is stressful on the back to sit or stand in one place. Do not sit, drive, or stand in one place for more than 30 minutes at a time. Take short walks on level surfaces as soon as pain allows.Try to increase the length of time you walk each day.   Do not stay in bed.Resting more than 1 or 2 days can delay your recovery.   Do not avoid exercise or work.Your body is made to move.It is not dangerous to be active, even though your back may hurt.Your back will likely heal faster if you return to being active before your pain is gone.   Pay attention to your body when you bend and lift. Many people have less discomfortwhen lifting if they bend their knees, keep the load close to their  bodies,and avoid twisting. Often, the most comfortable positions are those that put less stress on your recovering back.   Find a comfortable position to sleep. Use a firm mattress and lie on your side with your knees slightly bent. If you lie on your back, put a pillow under your knees.   Only take over-the-counter or prescription medicines as directed by your caregiver. Over-the-counter medicines to reduce pain and inflammation are often the most helpful.Your caregiver may prescribe muscle relaxant drugs.These medicines help dull your pain so you can more quickly return to your normal activities and healthy exercise.   Put ice on the injured area.   Put ice in a plastic bag.   Place a towel between your skin and the bag.   Leave the ice on for 15 to 20 minutes, 3 to 4 times a day for the first 2 to 3 days. After that, ice and heat may be alternated to reduce pain and spasms.   Ask your caregiver about trying back exercises and gentle massage. This may be of some benefit.   Avoid feeling anxious or stressed.Stress increases muscle tension and can worsen back pain.It is important to recognize when you are anxious or stressed and learn ways to manage it.Exercise is a great option.  SEEK MEDICAL CARE IF:  You have pain that is not relieved with rest or medicine.   You have   pain that does not improve in 1 week.   You have new symptoms.   You are generally not feeling well.  SEEK IMMEDIATE MEDICAL CARE IF:   You have pain that radiates from your back into your legs.   You develop new bowel or bladder control problems.   You have unusual weakness or numbness in your arms or legs.   You develop nausea or vomiting.   You develop abdominal pain.   You feel faint.  Document Released: 03/08/2005 Document Revised: 02/25/2011 Document Reviewed: 07/27/2010 [employer] Patient Information 2012 [employer]. 

## 2012-04-17 ENCOUNTER — Encounter (HOSPITAL_COMMUNITY): Payer: Self-pay | Admitting: *Deleted

## 2012-04-17 ENCOUNTER — Emergency Department (HOSPITAL_COMMUNITY)
Admission: EM | Admit: 2012-04-17 | Discharge: 2012-04-17 | Disposition: A | Discharge status: Home / Self Care | Payer: Self-pay | Attending: Emergency Medicine | Admitting: Emergency Medicine

## 2012-04-17 ENCOUNTER — Emergency Department (HOSPITAL_COMMUNITY): Payer: Self-pay

## 2012-04-17 DIAGNOSIS — R0781 Pleurodynia: Secondary | ICD-10-CM

## 2012-04-17 DIAGNOSIS — R079 Chest pain, unspecified: Secondary | ICD-10-CM | POA: Insufficient documentation

## 2012-04-17 DIAGNOSIS — Z79899 Other long term (current) drug therapy: Secondary | ICD-10-CM | POA: Insufficient documentation

## 2012-04-17 DIAGNOSIS — Z794 Long term (current) use of insulin: Secondary | ICD-10-CM | POA: Insufficient documentation

## 2012-04-17 DIAGNOSIS — J209 Acute bronchitis, unspecified: Secondary | ICD-10-CM | POA: Insufficient documentation

## 2012-04-17 DIAGNOSIS — E109 Type 1 diabetes mellitus without complications: Secondary | ICD-10-CM | POA: Insufficient documentation

## 2012-04-17 DIAGNOSIS — J3489 Other specified disorders of nose and nasal sinuses: Secondary | ICD-10-CM | POA: Insufficient documentation

## 2012-04-17 DIAGNOSIS — M549 Dorsalgia, unspecified: Secondary | ICD-10-CM | POA: Insufficient documentation

## 2012-04-17 DIAGNOSIS — F17200 Nicotine dependence, unspecified, uncomplicated: Secondary | ICD-10-CM | POA: Insufficient documentation

## 2012-04-17 MED ORDER — ALBUTEROL SULFATE HFA 108 (90 BASE) MCG/ACT IN AERS
2.0000 | INHALATION_SPRAY | RESPIRATORY_TRACT | Status: DC | PRN
  Administered 2012-04-17: 2 via RESPIRATORY_TRACT
  Filled 2012-04-17: qty 6.7

## 2012-04-17 MED ORDER — AZITHROMYCIN 250 MG PO TABS: ORAL_TABLET | ORAL | Status: DC

## 2012-04-17 MED ORDER — OXYCODONE-ACETAMINOPHEN 5-325 MG PO TABS
2.0000 | ORAL_TABLET | Freq: Once | ORAL | Status: AC
  Administered 2012-04-17: 2 via ORAL
  Filled 2012-04-17: qty 2

## 2012-04-17 MED ORDER — AZITHROMYCIN 250 MG PO TABS
500.0000 mg | ORAL_TABLET | Freq: Once | ORAL | Status: AC
  Administered 2012-04-17: 500 mg via ORAL
  Filled 2012-04-17: qty 2

## 2012-04-17 MED ORDER — OXYCODONE-ACETAMINOPHEN 5-325 MG PO TABS: 1.0000 | ORAL_TABLET | ORAL | Status: AC | PRN

## 2012-04-17 NOTE — ED Notes (Signed)
Cough, congestion for 2-3 days, Pain lower T spine area, "feels like I broke a rib".  Alert,

## 2012-04-17 NOTE — ED Notes (Signed)
RT here to instruct on ins spirometry. And inhaler.

## 2012-04-17 NOTE — ED Notes (Signed)
Sinus congestion x 2 days.  Cough began yesterday.  States is having lower back pain from coughing.  Denies fever.

## 2012-04-18 NOTE — ED Provider Notes (Signed)
History     CSN: [id number]  Arrival date & time 04/17/12  1117   First MD Initiated Contact with Patient 04/17/12 1126      Chief Complaint  Patient presents with  . Cough  . Back Pain  . Nasal Congestion    (Consider location/radiation/quality/duration/timing/severity/associated sxs/prior treatment) HPI Comments: Justin Moreno is a 47 y.o. Male presenting with a week long history of nasal congestion, post nasal drip and now a nonproductive cough which started yesterday.  He has developed pain in his left lower back which is worsened with palpation, movement and with deep inspiration and coughing since yesterday.  He denies fever and shortness of breath.  He denies fever and chills and has not had increased shortness of breath,  But has been breathing shallow to avoid pain.  He has taken tylenol without relief of symptoms.  He denies chest pain, palpitations, weakness or lightheadedness.  History is significant for diabetes. His last cbg was around 180,  Checked last night.  He denies dysuria, hematuria.  Denies history of kidney stone.       The history is provided by the patient.    Past Medical History  Diagnosis Date  . Juvenile diabetes     x 30 years    History reviewed. No pertinent past surgical history.  No family history on file.  History  Substance Use Topics  . Smoking status: Current Every Day Smoker -- 1.0 packs/day  . Smokeless tobacco: Not on file  . Alcohol Use: No      Review of Systems  Constitutional: Negative for fever and chills.  HENT: Negative for congestion, sore throat and neck pain.   Eyes: Negative.   Respiratory: Positive for cough. Negative for chest tightness, shortness of breath, wheezing and stridor.   Cardiovascular: Negative for chest pain.  Gastrointestinal: Negative for nausea and abdominal pain.  Genitourinary: Negative.   Musculoskeletal: Positive for back pain. Negative for joint swelling and arthralgias.  Skin:  Negative.  Negative for rash and wound.  Neurological: Negative for dizziness, weakness, light-headedness, numbness and headaches.  Hematological: Negative.   Psychiatric/Behavioral: Negative.     Allergies  Review of patient's allergies indicates no known allergies.  Home Medications   Current Outpatient Rx  Name  Route  Sig  Dispense  Refill  . ACETAMINOPHEN 500 MG PO TABS   Oral   Take 1,000 mg by mouth daily as needed. For pain          . BC HEADACHE POWDER PO   Oral   Take 1 Package by mouth 2 (two) times daily as needed. For pain          . INSULIN GLARGINE 100 UNIT/ML [place] SOLN   Subcutaneous   Inject 30 Units into the skin 2 (two) times daily.          . INSULIN LISPRO (HUMAN) 100 UNIT/ML [place] SOLN   Subcutaneous   Inject 2-40 Units into the skin daily as needed. This is a sliding scale medication. If blood sugar levels are 110= 2 units. If blood sugar levels are 350= 25 units         . NAPROXEN SODIUM 220 MG PO TABS   Oral   Take 220 mg by mouth daily as needed. For pain          . PSEUDOEPHEDRINE HCL 30 MG PO TABS   Oral   Take 30 mg by mouth every 4 (four) hours as needed. Sinus congestion.         Marland Kitchen  AZITHROMYCIN 250 MG PO TABS      Take one tablet daily for 4 days,  Taking the first tablet tomorrow.   4 tablet   0   . OXYCODONE-ACETAMINOPHEN 5-325 MG PO TABS   Oral   Take 1-2 tablets by mouth every 4 (four) hours as needed for pain (and coughing).   30 tablet   0     BP 139/88  Pulse 104  Temp 97.9 F (36.6 C) (Oral)  Resp 20  Ht 6\' 1"  (1.854 m)  Wt 205 lb (92.987 kg)  BMI 27.05 kg/m2  SpO2 100%  Physical Exam  Nursing note and vitals reviewed. Constitutional: He appears well-developed and well-nourished.  HENT:  Head: Normocephalic and atraumatic.  Eyes: Conjunctivae normal are normal.  Neck: Normal range of motion.  Cardiovascular: Normal rate, regular rhythm, normal heart sounds and intact distal pulses.   Pulmonary/Chest:  Effort normal and breath sounds normal. No accessory muscle usage. No respiratory distress. He has no decreased breath sounds. He has no wheezes. He has no rhonchi. He has no rales. He exhibits no tenderness.  Abdominal: Soft. Bowel sounds are normal. There is no tenderness.  Musculoskeletal: Normal range of motion. He exhibits tenderness.       Thoracic back: He exhibits bony tenderness. He exhibits no swelling and no edema.       Back:       Point tenderness to palpation left parathoracic area,  No crepitus,  Edema,  Erythema at site.    Neurological: He is alert.  Skin: Skin is warm and dry.  Psychiatric: He has a normal mood and affect.    ED Course  Procedures (including critical care time)  Labs Reviewed - No data to display Dg Chest 2 View  04/17/2012  *RADIOLOGY REPORT*  Clinical Data: Cough for back pain and nasal congestion.  CHEST - 2 VIEW  Comparison: 02/20/2012 and 12/07/2011 radiographs.  Findings: The heart size and mediastinal contours are stable.  The lungs appear stable; I believe there is mild chronic central airway thickening.  There is no hyperinflation, airspace disease or pleural effusion.  The osseous structures appear unremarkable.  IMPRESSION: Mild chronic central airway thickening.  No acute cardiopulmonary process.   Original Report Authenticated By: Carey Bullocks, M.D.      1. Bronchitis, acute   2. Rib pain on left side       MDM  xrays reviewed,  No pneumonia.  Pt with reproducible back pain and acute cough/bronchitis.  Doubt PE.  Pt prescribed zithromax,  Oxycodone,  Given incentive spirometer.  Encouraged recheck if not improving over the next several days.          Burgess Amor, [place] 04/18/12 1731

## 2012-04-18 NOTE — ED Provider Notes (Signed)
Medical screening examination/treatment/procedure(s) were performed by non-physician practitioner and as supervising physician I was immediately available for consultation/collaboration. Devoria Albe, MD, [facility]   Ward Givens, MD 04/18/12 2022

## 2012-11-05 ENCOUNTER — Emergency Department (HOSPITAL_COMMUNITY)
Admission: EM | Admit: 2012-11-05 | Discharge: 2012-11-07 | Disposition: A | Discharge status: Home / Self Care | Payer: Self-pay | Attending: Emergency Medicine | Admitting: Emergency Medicine

## 2012-11-05 ENCOUNTER — Encounter (HOSPITAL_COMMUNITY): Payer: Self-pay | Admitting: Emergency Medicine

## 2012-11-05 DIAGNOSIS — F17200 Nicotine dependence, unspecified, uncomplicated: Secondary | ICD-10-CM | POA: Insufficient documentation

## 2012-11-05 DIAGNOSIS — R6883 Chills (without fever): Secondary | ICD-10-CM | POA: Insufficient documentation

## 2012-11-05 DIAGNOSIS — E119 Type 2 diabetes mellitus without complications: Secondary | ICD-10-CM

## 2012-11-05 DIAGNOSIS — Z794 Long term (current) use of insulin: Secondary | ICD-10-CM | POA: Insufficient documentation

## 2012-11-05 DIAGNOSIS — F1110 Opioid abuse, uncomplicated: Secondary | ICD-10-CM

## 2012-11-05 DIAGNOSIS — F411 Generalized anxiety disorder: Secondary | ICD-10-CM | POA: Insufficient documentation

## 2012-11-05 DIAGNOSIS — Z7982 Long term (current) use of aspirin: Secondary | ICD-10-CM | POA: Insufficient documentation

## 2012-11-05 DIAGNOSIS — E109 Type 1 diabetes mellitus without complications: Secondary | ICD-10-CM | POA: Insufficient documentation

## 2012-11-05 DIAGNOSIS — G479 Sleep disorder, unspecified: Secondary | ICD-10-CM | POA: Insufficient documentation

## 2012-11-05 LAB — CBC WITH DIFFERENTIAL/PLATELET
Basophils Absolute: 0 10*3/uL (ref 0.0–0.1)
Eosinophils Relative: 0 % (ref 0–5)
HCT: 44 % (ref 39.0–52.0)
Hemoglobin: 15.8 g/dL (ref 13.0–17.0)
Lymphocytes Relative: 20 % (ref 12–46)
Lymphs Abs: 1.4 10*3/uL (ref 0.7–4.0)
MCV: 84.3 fL (ref 78.0–100.0)
Monocytes Absolute: 0.4 10*3/uL (ref 0.1–1.0)
Monocytes Relative: 6 % (ref 3–12)
Neutro Abs: 5.4 10*3/uL (ref 1.7–7.7)
RBC: 5.22 MIL/uL (ref 4.22–5.81)
WBC: 7.2 10*3/uL (ref 4.0–10.5)

## 2012-11-05 LAB — BASIC METABOLIC PANEL
Chloride: 101 mEq/L (ref 96–112)
GFR calc Af Amer: >90 mL/min (ref >90)
GFR calc non Af Amer: >90 mL/min (ref >90)
Potassium: 4.3 mEq/L (ref 3.5–5.1)
Sodium: 139 mEq/L (ref 135–145)

## 2012-11-05 LAB — RAPID URINE DRUG SCREEN, HOSP PERFORMED
Amphetamines: NOT DETECTED
Barbiturates: NOT DETECTED
Benzodiazepines: NOT DETECTED
Tetrahydrocannabinol: NOT DETECTED

## 2012-11-05 LAB — BASIC METABOLIC PANEL WITH GFR
BUN: 11 mg/dL (ref 6–23)
CO2: 30 meq/L (ref 19–32)
Calcium: 9.5 mg/dL (ref 8.4–10.5)
Creatinine, Ser: 0.85 mg/dL (ref 0.50–1.35)
Glucose, Bld: 207 mg/dL — ABNORMAL HIGH (ref 70–99)

## 2012-11-05 LAB — GLUCOSE, CAPILLARY
Glucose-Capillary: 195 mg/dL — ABNORMAL HIGH (ref 70–99)
Glucose-Capillary: 118 mg/dL — ABNORMAL HIGH (ref 70–99)

## 2012-11-05 LAB — ETHANOL: Alcohol, Ethyl (B): <11 mg/dL (ref 0–11)

## 2012-11-05 MED ORDER — LORAZEPAM 1 MG PO TABS
0.0000 mg | ORAL_TABLET | Freq: Four times a day (QID) | ORAL | Status: DC
  Administered 2012-11-05 – 2012-11-07 (×3): 1 mg via ORAL
  Filled 2012-11-05 (×3): qty 1

## 2012-11-05 MED ORDER — ONDANSETRON HCL 4 MG PO TABS: 8.0000 mg | ORAL_TABLET | Freq: Three times a day (TID) | ORAL | Status: DC | PRN

## 2012-11-05 MED ORDER — IBUPROFEN 400 MG PO TABS: 400.0000 mg | ORAL_TABLET | Freq: Four times a day (QID) | ORAL | Status: DC | PRN

## 2012-11-05 MED ORDER — NICOTINE 21 MG/24HR TD PT24
21.0000 mg | MEDICATED_PATCH | Freq: Once | TRANSDERMAL | Status: AC
  Administered 2012-11-05 – 2012-11-06 (×2): 21 mg via TRANSDERMAL
  Filled 2012-11-05 (×2): qty 1

## 2012-11-05 MED ORDER — LORAZEPAM 1 MG PO TABS: 0.0000 mg | ORAL_TABLET | Freq: Two times a day (BID) | ORAL | Status: DC

## 2012-11-05 MED ORDER — INSULIN ASPART 100 UNIT/ML ~~LOC~~ SOLN
0.0000 [IU] | Freq: Three times a day (TID) | SUBCUTANEOUS | Status: DC
  Administered 2012-11-06: 3 [IU] via SUBCUTANEOUS
  Administered 2012-11-06: 2 [IU] via SUBCUTANEOUS
  Administered 2012-11-06: 5 [IU] via SUBCUTANEOUS
  Filled 2012-11-05 (×3): qty 1

## 2012-11-05 MED ORDER — INSULIN GLARGINE 100 UNIT/ML ~~LOC~~ SOLN
30.0000 [IU] | Freq: Two times a day (BID) | SUBCUTANEOUS | Status: DC
  Administered 2012-11-06 – 2012-11-07 (×3): 30 [IU] via SUBCUTANEOUS
  Filled 2012-11-05 (×8): qty 0.3

## 2012-11-05 MED ORDER — CLONIDINE HCL 0.1 MG PO TABS
0.1000 mg | ORAL_TABLET | Freq: Two times a day (BID) | ORAL | Status: DC
  Administered 2012-11-05 – 2012-11-06 (×3): 0.1 mg via ORAL
  Filled 2012-11-05 (×4): qty 1

## 2012-11-05 NOTE — ED Provider Notes (Signed)
CSN: [id number]     Arrival date & time 11/05/12  1907 History    This chart was scribed for Gavin Pound. Oletta Lamas, MD, by Yevette Edwards, ED Scribe. This patient was seen in room APA17/APA17 and the patient's care was started at 7:30 PM.   First MD Initiated Contact with Patient 11/05/12 1913     Chief Complaint  Patient presents with  . V70.1    The history is provided by the patient. No language interpreter was used.   HPI Comments: Justin Moreno is a 47 y.o. 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 He injured his back in 1998, and he was prescribed percocet; his addiction began with that prescription and continued until 2010. He used to take narcotics intravenously prior to 2010.He was in a [employer] in 2010, and he stopped taking percocet and narcotics in 2010. He visited a ED in [place] in 2012, and he was prescribed percocet; his addiction returned. Since 2012, the pt takes 10-15 percocets or, any narcotic pain medication, a day.  He experienced withdrawals five days ago including chills. He denies experiencing any cold symptoms recently.  Yesterday, he took one roxy and 4 mg of seboxa. He reports that he is depressed. He denies using any alcohol. The pt takes Lantus injections, 30 in the morning and 30 in the evening, for his type I DM.     Past Medical History  Diagnosis Date  . Juvenile diabetes     x 30 years   History reviewed. No pertinent past surgical history. History reviewed. No pertinent family history. History  Substance Use Topics  . Smoking status: Current Every Day Smoker -- 1.00 packs/day  . Smokeless tobacco: Not on file  . Alcohol Use: No    Review of Systems  Constitutional: Positive for chills. Negative for fever.  HENT: Negative for congestion and sore throat.   Respiratory: Negative for cough.   Gastrointestinal: Negative for nausea, vomiting and abdominal pain.   Psychiatric/Behavioral: Positive for sleep disturbance (Due to non-prescribed medication). Negative for suicidal ideas. The patient is nervous/anxious.        Depression.   All other systems reviewed and are negative.    Allergies  Review of patient's allergies indicates no known allergies.  Home Medications   Current Outpatient Rx  Name  Route  Sig  Dispense  Refill  . Aspirin-Acetaminophen-Caffeine ([place] HEADACHE PO)   Oral   Take 1 packet by mouth 2 (two) times daily as needed (for pain).         . insulin glargine (LANTUS) 100 UNIT/ML injection   Subcutaneous   Inject 30 Units into the skin 2 (two) times daily.          . insulin regular (NOVOLIN R,HUMULIN R) 100 units/mL injection   Subcutaneous   Inject 2-40 Units into the skin 3 (three) times daily before meals. AS DIRECTED PER AGGRESSIVE SLIDING SCALE INSTRUCTIONS          Triage Vitals: BP 165/81  Pulse 78  Temp(Src) 98.2 F (36.8 C) (Oral)  Resp 20  Ht 6\' 2"  (1.88 m)  Wt 210 lb (95.255 kg)  BMI 26.95 kg/m2  SpO2 100%  Physical Exam  Nursing note and vitals reviewed. Constitutional: He is oriented to person, place, and time. He appears well-developed and well-nourished. No distress.  HENT:  Head: Normocephalic and atraumatic.  Eyes: EOM are normal.  Neck: Neck supple. No tracheal  deviation present.  Cardiovascular: Normal rate, regular rhythm and normal heart sounds.   Pulmonary/Chest: Effort normal. No respiratory distress.  Abdominal: Soft. He exhibits no distension. There is no tenderness.  Musculoskeletal: Normal range of motion.  Neurological: He is alert and oriented to person, place, and time.  Skin: Skin is warm and dry. He is not diaphoretic.  Psychiatric: He has a normal mood and affect. His speech is normal and behavior is normal. Judgment normal. His affect is not inappropriate. He does not exhibit a depressed mood.  Not suicidal.    ED Course   DIAGNOSTIC STUDIES:  Oxygen  Saturation is 100% on room air, normal by my interpretation.    COORDINATION OF CARE:  7:27 PM- Discussed treatment plan with patient, and the patient agreed to the plan.   Procedures (including critical care time)  Labs Reviewed  GLUCOSE, CAPILLARY - Abnormal; Notable for the following:    Glucose-Capillary 195 (*)    All other components within normal limits  BASIC METABOLIC PANEL - Abnormal; Notable for the following:    Glucose, Bld 207 (*)    All other components within normal limits  GLUCOSE, CAPILLARY - Abnormal; Notable for the following:    Glucose-Capillary 118 (*)    All other components within normal limits  CBC WITH DIFFERENTIAL  URINE RAPID DRUG SCREEN (HOSP PERFORMED)  ETHANOL   No results found. 1. Narcotic abuse   2. Diabetes    10:57 PM Labs complete.  Pt is diabetic, glucose is at 200.  Not in DKA. Pt is medically cleared for placement for detox.   MDM  I personally performed the services described in this documentation, which was scribed in my presence. The recorded information has been reviewed and considered.    Pt desiring detox from narcotics, mild withdrawal symptoms now.  No h/o seizures, hallucinations. No SI or HI now.  Will consult TTS to see if pt can be placed.  Pt has been to [facility] in the past.     Gavin Pound. Oletta Lamas, MD 11/05/12 2257

## 2012-11-05 NOTE — BH Assessment (Signed)
Assessment Note  Justin Moreno is an 47 y.o. male. Pt requests help with opiate addiction, reports problem use of pain pills going back to 1998.  Pt reports he was sober from 2010-2012 after going through [facility] and then an [address] but he began using again after returning home to [place].  Pt reports current use of 50-200mg  of percocet per day. He uses other opiates when he cannot get percocet and he uses suboxone when he cannot get any opiates. Pt UDS is negative for opiates but pt does appear to be sincere--reports he used a roxycontin yesterday but has been buying whatever he can get his hands on lately.  Pt denies use of alcohol or any illegal drugs.  Pt reports current depression, reports his marriage ended due to his drug use and he cannot hold a job.  Pt denies SI/HI/AV.  Pt reports that he has had SI at times in past six months when he can't get any pain meds but non currently.     Axis I: opioide dependence Axis II: Deferred Axis III:  Past Medical History  Diagnosis Date  . Juvenile diabetes     x 30 years   Axis IV: economic problems, occupational problems and problems with primary support group Axis V: 51-60 moderate symptoms  Past Medical History:  Past Medical History  Diagnosis Date  . Juvenile diabetes     x 30 years    History reviewed. No pertinent past surgical history.  Family History: History reviewed. No pertinent family history.  Social History:  reports that he has been smoking.  He does not have any smokeless tobacco history on file. He reports that he uses illicit drugs. He reports that he does not drink alcohol.  Additional Social History:  Alcohol / Drug Use Pain Medications: yes Prescriptions: yes Over the Counter: pt denies History of alcohol / drug use?: Yes Negative Consequences of Use: Financial;Personal relationships;Work / Programmer, multimedia Withdrawal Symptoms: Fever / Chills Substance #1 Name of Substance 1: opiates 1 - Age of First Use: 30 1 - Amount  (size/oz): 50-200mg  1 - Frequency: daily 1 - Duration: 2 years 1 - Last Use / Amount: 8/16, roxycontin 15mg ,   CIWA: CIWA-Ar BP: 165/81 mmHg Pulse Rate: 78 Nausea and Vomiting: no nausea and no vomiting Tactile Disturbances: none Tremor: no tremor Auditory Disturbances: not present Paroxysmal Sweats: no sweat visible Visual Disturbances: not present Anxiety: mildly anxious Headache, Fullness in Head: none present Agitation: normal activity (starting to feel fidgety but no outward signs at this time) Orientation and Clouding of Sensorium: oriented and can do serial additions CIWA-Ar Total: 1 COWS: Clinical Opiate Withdrawal Scale (COWS) Resting Pulse Rate: Pulse Rate 80 or below Sweating: Flushed or Observable moistness on face Restlessness: Able to sit still Pupil Size: Pupils pinned or normal size for room light Bone or Joint Aches: Patient reports sever diffuse aching of joints/muscles Runny Nose or Tearing: Nasal stuffiness or unusually moist eyes GI Upset: Stomach cramps Tremor: No tremor Yawning: No yawning Anxiety or Irritability: None Gooseflesh Skin: Skin is smooth COWS Total Score: 6  Allergies: No Known Allergies  Home Medications:  (Not in a hospital admission)  OB/GYN Status:  No LMP for male patient.  General Assessment Data Location of Assessment: [facility] ACT Assessment: Yes Is this a Tele or Face-to-Face Assessment?: Tele Assessment Is this an Initial Assessment or a Re-assessment for this encounter?: Initial Assessment Living Arrangements: Other relatives Can pt return to current living arrangement?: Yes Admission  Status: Voluntary     [facility] Crisis Care Plan Living Arrangements: Other relatives Name of Psychiatrist: none Name of Therapist: none     Risk to self Suicidal Ideation: No-Not Currently/Within Last 6 Months Suicidal Intent: No Is patient at risk for suicide?: No Suicidal Plan?: No Access to Means: No What has been your use of  drugs/alcohol within the last 12 months?: current opiate use Previous Attempts/Gestures: No Intentional Self Injurious Behavior: None Family Suicide History: No Recent stressful life event(s): Financial Problems;Job Loss;Other (Comment) (addiction) Persecutory voices/beliefs?: No Depression: Yes Depression Symptoms: Insomnia;Isolating;Fatigue;Guilt;Loss of interest in usual pleasures;Feeling worthless/self pity;Feeling angry/irritable Substance abuse history and/or treatment for substance abuse?: Yes Suicide prevention information given to non-admitted patients: Not applicable  Risk to Others Homicidal Ideation: No Thoughts of Harm to Others: No Current Homicidal Intent: No Current Homicidal Plan: No Access to Homicidal Means: No History of harm to others?: No Assessment of Violence: None Noted Does patient have access to weapons?: No Criminal Charges Pending?: No Does patient have a court date: No  Psychosis Hallucinations: None noted Delusions: None noted  Mental Status Report Appear/Hygiene: Other (Comment) (casual) Eye Contact: Good Motor Activity: Unremarkable Speech: Logical/coherent Level of Consciousness: Alert Mood: Other (Comment) (pleasant) Affect: Appropriate to circumstance Anxiety Level: Minimal Thought Processes: Coherent;Relevant Judgement: Unimpaired Orientation: Person;Place;Time;Situation Obsessive Compulsive Thoughts/Behaviors: None  Cognitive Functioning Concentration: Normal Memory: Recent Intact;Remote Intact IQ: Average Insight: Good Impulse Control: Fair Appetite: Poor Weight Loss: 10 Weight Gain: 0 Sleep: Decreased Total Hours of Sleep: 4 Vegetative Symptoms: None  ADLScreening [facility] Assessment Services) Patient's cognitive ability adequate to safely complete daily activities?: Yes Patient able to express need for assistance with ADLs?: Yes Independently performs ADLs?: Yes (appropriate for developmental age)  Prior Inpatient  Therapy Prior Inpatient Therapy: Yes ([facility], [facility], 1999 [facility]) Prior Therapy Dates: 2013 Prior Therapy Facilty/Provider(s): [facility] Reason for Treatment: psych  Prior Outpatient Therapy Prior Outpatient Therapy: No  ADL Screening (condition at time of admission) Patient's cognitive ability adequate to safely complete daily activities?: Yes Patient able to express need for assistance with ADLs?: Yes Independently performs ADLs?: Yes (appropriate for developmental age)       Abuse/Neglect Assessment (Assessment to be complete while patient is alone) Physical Abuse: Denies Verbal Abuse: Denies Sexual Abuse: Denies Exploitation of patient/patient's resources: Denies Self-Neglect: Denies Values / Beliefs Cultural Requests During Hospitalization: None Spiritual Requests During Hospitalization: None   Advance Directives (For Healthcare) Advance Directive: Patient does not have advance directive;Patient would not like information    Additional Information 1:1 In Past 12 Months?: No CIRT Risk: No Elopement Risk: No Does patient have medical clearance?: Yes     Disposition:  Disposition Initial Assessment Completed for this Encounter: Yes Disposition of Patient: Referred to Patient referred to: [facility]  On Site Evaluation by:   Reviewed with Physician:    Lorri Frederick 11/05/2012 9:09 PM

## 2012-11-05 NOTE — ED Notes (Signed)
Pt states he has taken his usual 2nd dose of lantus insulin @ 5pm. Needs no further insulin tonight. Dr. Oletta Lamas aware and we will recheck CBG hs.

## 2012-11-05 NOTE — ED Notes (Signed)
Talking nonstop with staff.

## 2012-11-05 NOTE — ED Notes (Signed)
Pt eating a meal of chicken and vegtables. Given HS meds. Pt states he's had insomnia and would like something to help him sleep. Medicated with Ativan 1mg  after consult with dr Oletta Lamas

## 2012-11-05 NOTE — ED Notes (Signed)
Patient states that he is addicted to Percocet and any type of oxycodone pain medication. Patient requesting treatment to get off pain medications.  Patient denies SI/HI.

## 2012-11-06 LAB — GLUCOSE, CAPILLARY
Glucose-Capillary: 79 mg/dL (ref 70–99)
Glucose-Capillary: 165 mg/dL — ABNORMAL HIGH (ref 70–99)
Glucose-Capillary: 168 mg/dL — ABNORMAL HIGH (ref 70–99)
Glucose-Capillary: 149 mg/dL — ABNORMAL HIGH (ref 70–99)
Glucose-Capillary: 202 mg/dL — ABNORMAL HIGH (ref 70–99)
Glucose-Capillary: 192 mg/dL — ABNORMAL HIGH (ref 70–99)

## 2012-11-06 NOTE — BH Assessment (Signed)
Staff from [facility] called and said they need "three blood sugars and we need to know if the patient has his own insulin." Spoke with Arthor Captain, RN who said Pt does not have insulin with him now but can get it. She said Pt's third glucose level will be taken at 0600.  Harlin Rain Ria Comment, [facility] Triage Specialist

## 2012-11-06 NOTE — ED Notes (Signed)
Spoke with [facility] case worker and provided information.

## 2012-11-06 NOTE — BH Assessment (Signed)
Staff at [facility] requested three glucose levels. Faxed labs to [facility] at [phone].  Harlin Rain Ria Comment, [facility] Triage Specialist

## 2012-11-06 NOTE — BHH Counselor (Signed)
Writer spoke to the nurse Shanda Bumps) at [phone] working with the patient.  Writer contacted [facility]  regarding the patients acceptance to [facility] since receiving the updated glucose levels.    Writer spoke to Claris Che) at [facility] and she informed me that she would need to call me back regarding the patients being placed at their program.

## 2012-11-06 NOTE — ED Notes (Signed)
Updated VS faxed to [facility]

## 2012-11-06 NOTE — BHH Counselor (Signed)
Writer spoke to the nurse at [facility]) and was informed that she would have to call me back again.  The nurse reports that she did not follow up on the initial request regarding the placement status of this patient.  The nurse has requested updated vitals on the patient in order to consider him for placement.    Writer contacted [facility] and spoke to the nurse Jose Persia) regarding the patient receiving updated vitals and labs.  The nurse reported that she faxed this information to [facility].  Writer confirmed that nurse at [facility] did receive the information.

## 2012-11-06 NOTE — ED Notes (Signed)
Meal tray provided.

## 2012-11-06 NOTE — BHH Counselor (Signed)
Writer was informed by [facility] that he has been accepted.  [facility] nurse has requested his demographic information. Writer has faxed his demographic information to [facility].

## 2012-11-06 NOTE — Progress Notes (Signed)
ED/CM noted patient did not have health insurance and/or PCP listed in the computer.  Patient was given the [place] resource handout with information on the clinics, food pantries, and the handout for new health insurance sign-up.  Patient expressed appreciation for this. 

## 2012-11-07 LAB — GLUCOSE, CAPILLARY: Glucose-Capillary: 107 mg/dL — ABNORMAL HIGH (ref 70–99)

## 2012-11-07 NOTE — ED Notes (Signed)
[facility] ACT spoke with patient about plan of care.

## 2012-11-07 NOTE — ED Notes (Signed)
Patient resting quietly in bed with eyes closed.  Patient awakens to verbal stimuli; states has been trying to sleep but unable to.

## 2012-11-07 NOTE — Progress Notes (Signed)
Wynetta Emery, MHT contacted [facility] at 5:05 am spoke with Curt Bears) who suggest contacting [facility] again this am to check status of [place] bed availability.

## 2012-11-07 NOTE — ED Provider Notes (Addendum)
Discussed with ACT team this morning. Justin Moreno does not have a bed will not have a bed all day. Do not know about tomorrow. Patient no longer meets criteria for detox because been in the emergency department so many days he is going to do detox here. Patient is willing to be discharged. Patient medically stable for discharge.  [name], MD 11/07/12 1057  [name], MD 11/07/12 1057

## 2012-11-07 NOTE — BH Assessment (Signed)
Patient was referred to detox programs ([facility] and [facility]) by previous staff. Pt's last use of opiates was 11/04/2012 and per Joni Reining at [facility] patient does not meet criteria at this time. Patient also does not meet criteria for [facility]'s detox program at this time. Writer discussed this with patient who still would like inpatient treatment. Writer suggested residential treatment at [facility] if a bed is availability. Writer contacted [facility] and spoke to staff-Stacy who will locate patient's paperwork and review for a residential bed. Kennyth Arnold took this writers contact number stating she will call back with a updated disposition.

## 2012-11-07 NOTE — Progress Notes (Signed)
Justin Moreno, MHT conducted placement search for a 47 year old male who is seeking detox for opiates. Writer spoke with Justin Moreno at [facility] who reports one bed available and is willing to review referral for admission. Referral has been faxed for review and is inclusive of lab reports and [facility] assessment findings. Writer informed Justin Moreno to contact assessment department at [phone] should patient be accepted, declined, or if additional information is required. Should patient be accepted authorization will need to be completed with [facility] and patient transfer arranged from [facility] location. Patient must have access to all home mediations three to four day supply of insulin upon arrival if accepted and must have own transportation from facility after discharge.

## 2012-11-07 NOTE — ED Notes (Signed)
Pt given 1 large duffel bag.  Escorted to security desk to sign out valuables.

## 2012-11-07 NOTE — ED Notes (Signed)
Patient given graham crackers and peanut butter per request.

## 2013-01-25 ENCOUNTER — Emergency Department (HOSPITAL_COMMUNITY)
Admission: EM | Admit: 2013-01-25 | Discharge: 2013-01-25 | Discharge status: Against Medical Advice | Payer: Self-pay | Attending: Emergency Medicine | Admitting: Emergency Medicine

## 2013-01-25 ENCOUNTER — Encounter (HOSPITAL_COMMUNITY): Payer: Self-pay | Admitting: Emergency Medicine

## 2013-01-25 DIAGNOSIS — E109 Type 1 diabetes mellitus without complications: Secondary | ICD-10-CM | POA: Insufficient documentation

## 2013-01-25 DIAGNOSIS — F410 Panic disorder [episodic paroxysmal anxiety] without agoraphobia: Secondary | ICD-10-CM | POA: Insufficient documentation

## 2013-01-25 DIAGNOSIS — F17200 Nicotine dependence, unspecified, uncomplicated: Secondary | ICD-10-CM | POA: Insufficient documentation

## 2013-01-25 NOTE — ED Notes (Signed)
Unable to locate pt in all waiting areas x 3 attempts.  

## 2013-01-25 NOTE — ED Notes (Addendum)
Onset 4 pm, felt like he was having a "panic attack"  Feels better now. Says he had pain in his chest lasting 20 sec. Then it went away.  No pain at present.  Alert, Says his sister gave him part of a valium and it helped.  Pt also took a nitro that belonged to a neighbor that was 47 years old

## 2013-01-25 NOTE — ED Notes (Signed)
Unable to locate pt in the waiting areas. Another patient states "he said he wasn't able to wait any longer"  And reports that "He left about 20 minutes ago"

## 2013-05-22 ENCOUNTER — Emergency Department (HOSPITAL_COMMUNITY)
Admission: EM | Admit: 2013-05-22 | Discharge: 2013-05-23 | Disposition: A | Discharge status: Home / Self Care | Payer: Self-pay | Attending: Emergency Medicine | Admitting: Emergency Medicine

## 2013-05-22 ENCOUNTER — Encounter (HOSPITAL_COMMUNITY): Payer: Self-pay | Admitting: Emergency Medicine

## 2013-05-22 ENCOUNTER — Emergency Department (HOSPITAL_COMMUNITY): Payer: Self-pay

## 2013-05-22 DIAGNOSIS — F329 Major depressive disorder, single episode, unspecified: Secondary | ICD-10-CM | POA: Insufficient documentation

## 2013-05-22 DIAGNOSIS — L02519 Cutaneous abscess of unspecified hand: Secondary | ICD-10-CM | POA: Insufficient documentation

## 2013-05-22 DIAGNOSIS — L03113 Cellulitis of right upper limb: Secondary | ICD-10-CM

## 2013-05-22 DIAGNOSIS — F3289 Other specified depressive episodes: Secondary | ICD-10-CM | POA: Insufficient documentation

## 2013-05-22 DIAGNOSIS — W540XXA Bitten by dog, initial encounter: Secondary | ICD-10-CM | POA: Insufficient documentation

## 2013-05-22 DIAGNOSIS — E109 Type 1 diabetes mellitus without complications: Secondary | ICD-10-CM | POA: Insufficient documentation

## 2013-05-22 DIAGNOSIS — L03119 Cellulitis of unspecified part of limb: Secondary | ICD-10-CM

## 2013-05-22 DIAGNOSIS — Y9289 Other specified places as the place of occurrence of the external cause: Secondary | ICD-10-CM | POA: Insufficient documentation

## 2013-05-22 DIAGNOSIS — F17200 Nicotine dependence, unspecified, uncomplicated: Secondary | ICD-10-CM | POA: Insufficient documentation

## 2013-05-22 DIAGNOSIS — Y9389 Activity, other specified: Secondary | ICD-10-CM | POA: Insufficient documentation

## 2013-05-22 DIAGNOSIS — Z79899 Other long term (current) drug therapy: Secondary | ICD-10-CM | POA: Insufficient documentation

## 2013-05-22 DIAGNOSIS — Z794 Long term (current) use of insulin: Secondary | ICD-10-CM | POA: Insufficient documentation

## 2013-05-22 DIAGNOSIS — IMO0002 Reserved for concepts with insufficient information to code with codable children: Secondary | ICD-10-CM | POA: Insufficient documentation

## 2013-05-22 DIAGNOSIS — S61451A Open bite of right hand, initial encounter: Secondary | ICD-10-CM

## 2013-05-22 HISTORY — DX: Depression, unspecified: F32.A

## 2013-05-22 HISTORY — DX: Major depressive disorder, single episode, unspecified: F32.9

## 2013-05-22 NOTE — ED Notes (Signed)
Pt states a week ago he was trying to break up a dog fight and ended up hitting one of the dogs in the head with his fist, thinks his hand may have caught the dogs teeth, and since then, has had some increased redness and pain to site.  Pt is able to open and close hand with minimal diff

## 2013-05-22 NOTE — ED Provider Notes (Signed)
CSN: [id number]     Arrival date & time 05/22/13  2302 History   First MD Initiated Contact with Patient 05/22/13 2352 This chart was scribed for Justin Moreno B. Bernette Mayers, MD by Valera Castle, ED Scribe. This patient was seen in room APA16A/[facility]16A and the patient's care was started at 11:57 AM.     Chief Complaint  Patient presents with  . Hand Injury   (Consider location/radiation/quality/duration/timing/severity/associated sxs/prior Treatment) The history is provided by the patient. No language interpreter was used.   HPI Comments: Justin Moreno is a 48 y.o. male who presents to the Emergency Department complaining of constant right hand pain over his knuckles, onset 1 week ago after breaking up a dog fight. He states he hit one of the dogs in the head and may have the caught dog's mouth. He states the pain intermittently radiates to his right wrist, forearm. He reports associated increasing swelling and redness since the incident. He denies any other associated symptoms. He reports h/o DM, states he checks his levels regularly at home.   Past Medical History  Diagnosis Date  . Juvenile diabetes     x 30 years  . Depression    History reviewed. No pertinent past surgical history. No family history on file. History  Substance Use Topics  . Smoking status: Current Every Day Smoker -- 1.00 packs/day  . Smokeless tobacco: Not on file  . Alcohol Use: No    Review of Systems A complete 10 system review of systems was obtained and all systems are negative except as noted in the HPI and PMH.   Allergies  Review of patient's allergies indicates no known allergies.  Home Medications   Current Outpatient Rx  Name  Route  Sig  Dispense  Refill  . citalopram (CELEXA) 20 MG tablet   Oral   Take 20 mg by mouth daily.         . insulin glargine (LANTUS) 100 UNIT/ML injection   Subcutaneous   Inject 30 Units into the skin 2 (two) times daily.          . insulin regular (NOVOLIN  R,HUMULIN R) 100 units/mL injection   Subcutaneous   Inject 2-40 Units into the skin 3 (three) times daily before meals. AS DIRECTED PER AGGRESSIVE SLIDING SCALE INSTRUCTIONS         . Aspirin-Acetaminophen-Caffeine (GOODY HEADACHE PO)   Oral   Take 1 packet by mouth 2 (two) times daily as needed (for pain).          BP 149/77  Pulse 85  Temp(Src) 98 F (36.7 C) (Oral)  Resp 18  Ht 6\' 1"  (1.854 m)  Wt 205 lb (92.987 kg)  BMI 27.05 kg/m2  SpO2 98%  Physical Exam  Constitutional: He is oriented to person, place, and time. He appears well-developed and well-nourished.  HENT:  Head: Normocephalic and atraumatic.  Neck: Neck supple.  Pulmonary/Chest: Effort normal.  Musculoskeletal:  Multiple superficial abrasions to hands in various stages of healing, there is mild erythema and warmth surrounding one abrasion overlying the R 4th MCP joint, no fluctuance, no pain with ROM of joint  Neurological: He is alert and oriented to person, place, and time. No cranial nerve deficit.  Psychiatric: He has a normal mood and affect. His behavior is normal.    ED Course  Procedures (including critical care time)  DIAGNOSTIC STUDIES: Oxygen Saturation is 98% on room air, normal by my interpretation.    COORDINATION OF CARE: 12:00 AM-Discussed treatment  plan which includes Amoxicillin with pt at bedside and pt agreed to plan.   Labs Review Labs Reviewed - No data to display Imaging Review No results found.   EKG Interpretation None     Medications - No data to display MDM   Final diagnoses:  Dog bite of right hand  Cellulitis of right hand    Pt with late but mild cellulitis of R hand after ?dog bite a week ago. Will give Rx for Augmentin. He states he often gets intertriginous yeast infections while taking Abx, typically treated with 10 days of daily diflucan.   I personally performed the services described in this documentation, which was scribed in my presence. The  recorded information has been reviewed and is accurate.      Justin Moreno B. Bernette MayersSheldon, MD 05/23/13 [id number]

## 2013-05-23 MED ORDER — AMOXICILLIN-POT CLAVULANATE 875-125 MG PO TABS: 1.0000 | ORAL_TABLET | Freq: Two times a day (BID) | ORAL | Status: DC

## 2013-05-23 MED ORDER — AMOXICILLIN-POT CLAVULANATE 875-125 MG PO TABS
1.0000 | ORAL_TABLET | Freq: Once | ORAL | Status: AC
  Administered 2013-05-23: 1 via ORAL
  Filled 2013-05-23: qty 1

## 2013-05-23 MED ORDER — FLUCONAZOLE 100 MG PO TABS
100.0000 mg | ORAL_TABLET | Freq: Once | ORAL | Status: AC
  Administered 2013-05-23: 100 mg via ORAL
  Filled 2013-05-23: qty 1

## 2013-05-23 MED ORDER — FLUCONAZOLE 100 MG PO TABS
100.0000 mg | ORAL_TABLET | Freq: Every day | ORAL | Status: DC
Start: 2013-05-23 — End: 2015-09-15

## 2013-05-23 NOTE — Discharge Instructions (Signed)
Animal Bite °An animal bite can result in a scratch on the skin, deep open cut, puncture of the skin, crush injury, or tearing away of the skin or a body part. Dogs are responsible for most animal bites. Children are bitten more often than adults. An animal bite can range from very mild to more serious. A small bite from your house pet is no cause for alarm. However, some animal bites can become infected or injure a bone or other tissue. You must seek medical care if: °· The skin is broken and bleeding does not slow down or stop after 15 minutes. °· The puncture is deep and difficult to clean (such as a cat bite). °· Pain, warmth, redness, or pus develops around the wound. °· The bite is from a stray animal or rodent. There may be a risk of rabies infection. °· The bite is from a snake, raccoon, skunk, fox, coyote, or bat. There may be a risk of rabies infection. °· The person bitten has a chronic illness such as diabetes, liver disease, or cancer, or the person takes medicine that lowers the immune system. °· There is concern about the location and severity of the bite. °It is important to clean and protect an animal bite wound right away to prevent infection. Follow these steps: °· Clean the wound with plenty of water and soap. °· Apply an antibiotic cream. °· Apply gentle pressure over the wound with a clean towel or gauze to slow or stop bleeding. °· Elevate the affected area above the heart to help stop any bleeding. °· Seek medical care. Getting medical care within 8 hours of the animal bite leads to the best possible outcome. °DIAGNOSIS  °Your caregiver will most likely: °· Take a detailed history of the animal and the bite injury. °· Perform a wound exam. °· Take your medical history. °Blood tests or X-rays may be performed. Sometimes, infected bite wounds are cultured and sent to a lab to identify the infectious bacteria.  °TREATMENT  °Medical treatment will depend on the location and type of animal bite as  well as the patient's medical history. Treatment may include: °· Wound care, such as cleaning and flushing the wound with saline solution, bandaging, and elevating the affected area. °· Antibiotics. °· Tetanus immunization. °· Rabies immunization. °· Leaving the wound open to heal. This is often done with animal bites, due to the high risk of infection. However, in certain cases, wound closure with stitches, wound adhesive, skin adhesive strips, or staples may be used. ° Infected bites that are left untreated may require intravenous (IV) antibiotics and surgical treatment in the hospital. °HOME CARE INSTRUCTIONS °· Follow your caregiver's instructions for wound care. °· Take all medicines as directed. °· If your caregiver prescribes antibiotics, take them as directed. Finish them even if you start to feel better. °· Follow up with your caregiver for further exams or immunizations as directed. °You may need a tetanus shot if: °· You cannot remember when you had your last tetanus shot. °· You have never had a tetanus shot. °· The injury broke your skin. °If you get a tetanus shot, your arm may swell, get red, and feel warm to the touch. This is common and not a problem. If you need a tetanus shot and you choose not to have one, there is a rare chance of getting tetanus. Sickness from tetanus can be serious. °SEEK MEDICAL CARE IF: °· You notice warmth, redness, soreness, swelling, pus discharge, or a bad   smell coming from the wound. °· You have a red line on the skin coming from the wound. °· You have a fever, chills, or a general ill feeling. °· You have nausea or vomiting. °· You have continued or worsening pain. °· You have trouble moving the injured part. °· You have other questions or concerns. °MAKE SURE YOU: °· Understand these instructions. °· Will watch your condition. °· Will get help right away if you are not doing well or get worse. °Document Released: 11/24/2010 Document Revised: 05/31/2011 Document  Reviewed: 11/24/2010 °[employer]® Patient Information ©2014 [employer]. ° °Cellulitis °Cellulitis is an infection of the skin and the tissue beneath it. The infected area is usually red and tender. Cellulitis occurs most often in the arms and lower legs.  °CAUSES  °Cellulitis is caused by bacteria that enter the skin through cracks or cuts in the skin. The most common types of bacteria that cause cellulitis are Staphylococcus and Streptococcus. °SYMPTOMS  °· Redness and warmth. °· Swelling. °· Tenderness or pain. °· Fever. °DIAGNOSIS  °Your caregiver can usually determine what is wrong based on a physical exam. Blood tests may also be done. °TREATMENT  °Treatment usually involves taking an antibiotic medicine. °HOME CARE INSTRUCTIONS  °· Take your antibiotics as directed. Finish them even if you start to feel better. °· Keep the infected arm or leg elevated to reduce swelling. °· Apply a warm cloth to the affected area up to 4 times per day to relieve pain. °· Only take over-the-counter or prescription medicines for pain, discomfort, or fever as directed by your caregiver. °· Keep all follow-up appointments as directed by your caregiver. °SEEK MEDICAL CARE IF:  °· You notice red streaks coming from the infected area. °· Your red area gets larger or turns dark in color. °· Your bone or joint underneath the infected area becomes painful after the skin has healed. °· Your infection returns in the same area or another area. °· You notice a swollen bump in the infected area. °· You develop new symptoms. °SEEK IMMEDIATE MEDICAL CARE IF:  °· You have a fever. °· You feel very sleepy. °· You develop vomiting or diarrhea. °· You have a general ill feeling (malaise) with muscle aches and pains. °MAKE SURE YOU:  °· Understand these instructions. °· Will watch your condition. °· Will get help right away if you are not doing well or get worse. °Document Released: 12/16/2004 Document Revised: 09/07/2011 Document Reviewed:  05/24/2011 °[employer]® Patient Information ©2014 [employer]. ° °

## 2014-08-24 IMAGING — CR DG CHEST 2V
2 series · 2 of 2 positions shown · non-contrast
Comparison: 02/20/2012 and 12/07/2011 radiographs.

CLINICAL DATA: Cough for back pain and nasal congestion.

CHEST - 2 VIEW

[view not recorded (1 of 2)]
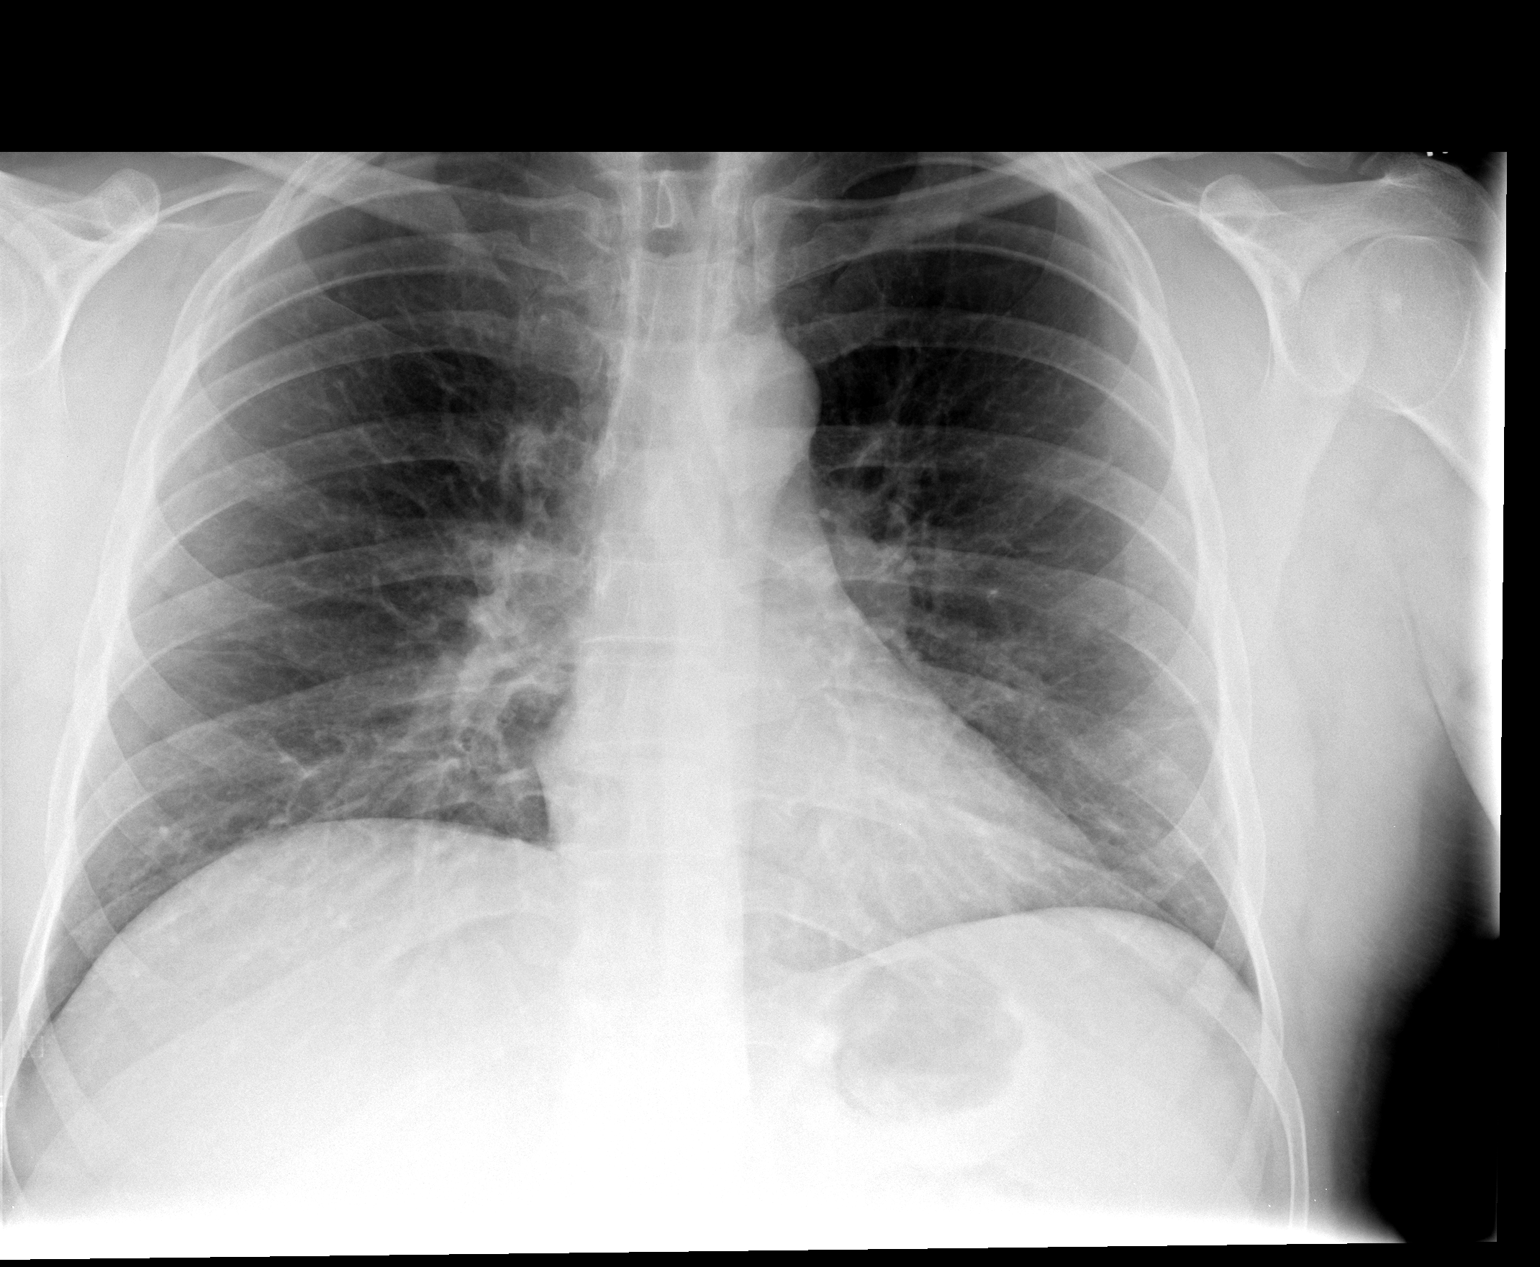

[view not recorded (2 of 2)]
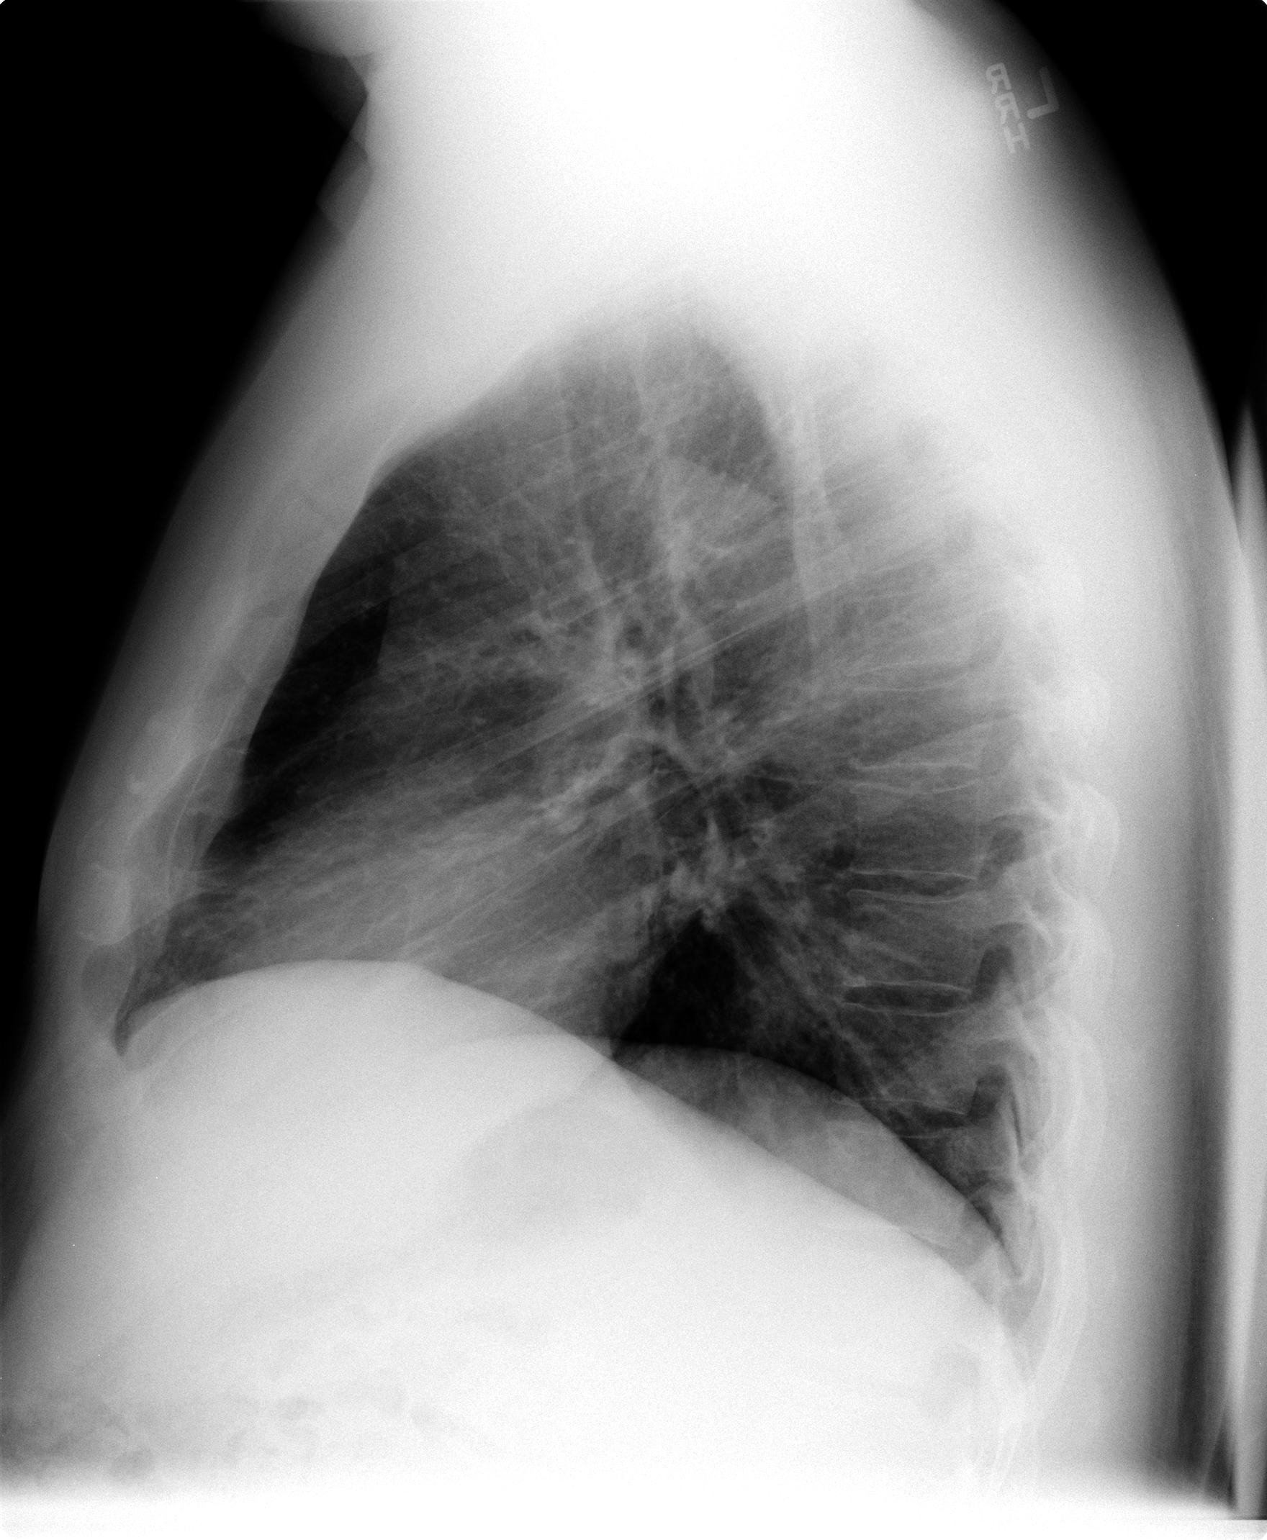

[2 of 2 positions shown; findings below may reference images not displayed]

FINDINGS: The heart size and mediastinal contours are stable.  The
lungs appear stable; I believe there is mild chronic central airway
thickening.  There is no hyperinflation, airspace disease or
pleural effusion.  The osseous structures appear unremarkable.
IMPRESSION: Mild chronic central airway thickening.  No acute cardiopulmonary
process.

## 2015-09-15 ENCOUNTER — Emergency Department (HOSPITAL_COMMUNITY)
Admission: EM | Admit: 2015-09-15 | Discharge: 2015-09-15 | Disposition: A | Discharge status: Home / Self Care | Payer: Self-pay | Attending: Emergency Medicine | Admitting: Emergency Medicine

## 2015-09-15 ENCOUNTER — Encounter (HOSPITAL_COMMUNITY): Payer: Self-pay | Admitting: Emergency Medicine

## 2015-09-15 DIAGNOSIS — F17200 Nicotine dependence, unspecified, uncomplicated: Secondary | ICD-10-CM | POA: Insufficient documentation

## 2015-09-15 DIAGNOSIS — E109 Type 1 diabetes mellitus without complications: Secondary | ICD-10-CM | POA: Insufficient documentation

## 2015-09-15 DIAGNOSIS — Z760 Encounter for issue of repeat prescription: Secondary | ICD-10-CM | POA: Insufficient documentation

## 2015-09-15 DIAGNOSIS — F329 Major depressive disorder, single episode, unspecified: Secondary | ICD-10-CM | POA: Insufficient documentation

## 2015-09-15 LAB — CBG MONITORING, ED: Glucose-Capillary: 101 mg/dL — ABNORMAL HIGH (ref 65–99)

## 2015-09-15 MED ORDER — CITALOPRAM HYDROBROMIDE 20 MG PO TABS: 20.0000 mg | ORAL_TABLET | Freq: Every day | ORAL | Status: DC

## 2015-09-15 MED ORDER — INSULIN GLARGINE 100 UNIT/ML ~~LOC~~ SOLN: 30.0000 [IU] | Freq: Two times a day (BID) | SUBCUTANEOUS | Status: DC

## 2015-09-15 NOTE — ED Notes (Addendum)
Patient states he is out of his medication, states he missed his last appointment. States he is out of his lantus and celexa. Complaining of pain to bilateral shoulder due to not having celexa "for diabetic nerve pain." Patient's CBG 101 in triage.

## 2015-09-15 NOTE — Discharge Instructions (Signed)
Medicine Refill at the Emergency Department  We have refilled your medicine today, but it is best for you to get refills through your primary health care provider's office. In the future, please plan ahead so you do not need to get refills from the emergency department.  If the medicine we refilled was a maintenance medicine, you may have received only enough to get you by until you are able to see your regular health care provider.     This information is not intended to replace advice given to you by your health care provider. Make sure you discuss any questions you have with your health care provider.     Document Released: 06/25/2003 Document Revised: 03/29/2014 Document Reviewed: 06/15/2013  [employer]vier Interactive Patient Education 2016 [employer]

## 2015-09-15 NOTE — ED Provider Notes (Signed)
CSN: [id number]     Arrival date & time 09/15/15  1458 History  By signing my name below, I, [facility] Washington, attest that this documentation has been prepared under the direction and in the presence of Roxy Horsemanobert Quadry Kampa, PA-C. Electronically Signed: Randell PatientMarrissa Washington, ED Scribe. 09/15/2015. 3:28 PM.    Chief Complaint  Patient presents with  . Medication Refill    The history is provided by the patient. No language interpreter was used.   HPI Comments: Eldred MangesDarrell W Kistner is a 50 y.o. male who presents to the Emergency Department for a medication refill of his Celexa and Lantus. Pt states that he ran out of his prescribed medications Lantus 100 Unit/mL and Celexa 40 mg and [facility] He has scheduled another appointment with his PCP for next month. He reports associated bilateral shoulder pain due to not taking his prescribed Celexa. Denies weakness.  Past Medical History  Diagnosis Date  . Juvenile diabetes     x 30 years  . Depression    History reviewed. No pertinent past surgical history. History reviewed. No pertinent family history. Social History  Substance Use Topics  . Smoking status: Current Every Day Smoker -- 1.00 packs/day  . Smokeless tobacco: None  . Alcohol Use: No    Review of Systems  Musculoskeletal: Positive for arthralgias (bilateral shoulder).  Neurological: Negative for weakness.      Allergies  Review of patient's allergies indicates no known allergies.  Home Medications   Prior to Admission medications   Medication Sig Start Date End Date Taking? Authorizing Provider  amoxicillin-clavulanate (AUGMENTIN) 875-125 MG per tablet Take 1 tablet by mouth 2 (two) times daily. 05/23/13   Susy Frizzleharles Sheldon, MD  Aspirin-Acetaminophen-Caffeine (GOODY HEADACHE PO) Take 1 packet by mouth 2 (two) times daily as needed (for pain).    Historical Provider, MD  citalopram (CELEXA) 20 MG tablet Take 20 mg by mouth  daily.    Historical Provider, MD  fluconazole (DIFLUCAN) 100 MG tablet Take 1 tablet (100 mg total) by mouth daily. 05/23/13   Susy Frizzleharles Sheldon, MD  insulin glargine (LANTUS) 100 UNIT/ML injection Inject 30 Units into the skin 2 (two) times daily.     Historical Provider, MD  insulin regular (NOVOLIN R,HUMULIN R) 100 units/mL injection Inject 2-40 Units into the skin 3 (three) times daily before meals. AS DIRECTED PER AGGRESSIVE SLIDING SCALE INSTRUCTIONS    Historical Provider, MD   BP 150/87 mmHg  Pulse 75  Temp(Src) 98 F (36.7 C) (Temporal)  Resp 18  Ht 6\' 1"  (1.854 m)  Wt 210 lb (95.255 kg)  BMI 27.71 kg/m2  SpO2 99% Physical Exam  Constitutional: He is oriented to person, place, and time. He appears well-developed and well-nourished. No distress.  HENT:  Head: Normocephalic and atraumatic.  Eyes: Conjunctivae are normal.  Neck: Normal range of motion.  Cardiovascular: Normal rate, regular rhythm and normal heart sounds.  Exam reveals no gallop and no friction rub.   No murmur heard. Pulmonary/Chest: Effort normal and breath sounds normal. No respiratory distress. He has no wheezes. He has no rales.  Musculoskeletal: Normal range of motion.  Neurological: He is alert and oriented to person, place, and time.  Skin: Skin is warm and dry.  Psychiatric: He has a normal mood and affect. His behavior is normal.  Nursing note and vitals reviewed.   ED Course  Procedures   DIAGNOSTIC STUDIES: Oxygen Saturation is 99% on RA, normal by my interpretation.  COORDINATION OF CARE: 3:25 PM Will refill Celexa and Lantus prescriptions. Advised pt to follow-up with PCP. Will discharge pt. Discussed treatment plan with pt at bedside and pt agreed to plan.   Labs Review Labs Reviewed  CBG MONITORING, ED - Abnormal; Notable for the following:    Glucose-Capillary 101 (*)    All other components within normal limits    I have personally reviewed and evaluated lab results as part of my  medical decision-making.    MDM   Final diagnoses:  Medication refill      I personally performed the services described in this documentation, which was scribed in my presence. The recorded information has been reviewed and is accurate.      Roxy HorsemanRobert Charmion Hapke, PA-C 09/15/15 1536  Marily MemosJason Mesner, MD 09/16/15 [phone]

## 2018-01-25 ENCOUNTER — Ambulatory Visit: Payer: Self-pay | Admitting: "Endocrinology

## 2018-03-30 ENCOUNTER — Ambulatory Visit (INDEPENDENT_AMBULATORY_CARE_PROVIDER_SITE_OTHER): Payer: Self-pay | Admitting: "Endocrinology

## 2018-03-30 ENCOUNTER — Encounter: Payer: Self-pay | Admitting: "Endocrinology

## 2018-03-30 VITALS — BP 142/84 | HR 79 | Ht 73.0 in | Wt 211.0 lb

## 2018-03-30 DIAGNOSIS — E1065 Type 1 diabetes mellitus with hyperglycemia: Secondary | ICD-10-CM

## 2018-03-30 DIAGNOSIS — I10 Essential (primary) hypertension: Secondary | ICD-10-CM

## 2018-03-30 DIAGNOSIS — F17200 Nicotine dependence, unspecified, uncomplicated: Secondary | ICD-10-CM | POA: Insufficient documentation

## 2018-03-30 DIAGNOSIS — E782 Mixed hyperlipidemia: Secondary | ICD-10-CM

## 2018-03-30 NOTE — Progress Notes (Signed)
Endocrinology Consult Note       03/30/2018, 4:58 PM   Subjective:    Patient ID: Justin Moreno, male    DOB: Feb 21, 1966.  Justin Moreno is being seen in consultation for management of currently uncontrolled symptomatic type 1 diabetes requested by  Justin Pac, NP.   Past Medical History:  Diagnosis Date  . Depression   . Hyperlipidemia   . Juvenile diabetes    x 30 years   History reviewed. No pertinent surgical history. Social History   Socioeconomic History  . Marital status: Single    Spouse name: Not on file  . Number of children: Not on file  . Years of education: Not on file  . Highest education level: Not on file  Occupational History  . Not on file  Social Needs  . Financial resource strain: Not on file  . Food insecurity:    Worry: Not on file    Inability: Not on file  . Transportation needs:    Medical: Not on file    Non-medical: Not on file  Tobacco Use  . Smoking status: Current Every Day Smoker    Packs/day: 0.50    Years: 23.00    Pack years: 11.50    Types: Cigarettes  . Smokeless tobacco: Never Used  Substance and Sexual Activity  . Alcohol use: No  . Drug use: No    Comment: narcotic pain medications-last use 11/04/2012  . Sexual activity: Not on file  Lifestyle  . Physical activity:    Days per week: Not on file    Minutes per session: Not on file  . Stress: Not on file  Relationships  . Social connections:    Talks on phone: Not on file    Gets together: Not on file    Attends religious service: Not on file    Active member of club or organization: Not on file    Attends meetings of clubs or organizations: Not on file    Relationship status: Not on file  Other Topics Concern  . Not on file  Social History Narrative  . Not on file   Outpatient Encounter Medications as of 03/30/2018  Medication Sig  . gabapentin (NEURONTIN) 300 MG capsule  Take 600 mg by mouth every morning.  . [DISCONTINUED] Insulin Detemir (LEVEMIR FLEXTOUCH Justin Moreno) Inject 40 Units into the skin at bedtime.  Marland Kitchen HUMALOG KWIKPEN 100 UNIT/ML KwikPen Inject 10-16 Units into the skin 3 (three) times daily before meals.  . Insulin Glargine (BASAGLAR KWIKPEN) 100 UNIT/ML SOPN 30 Units 2 (two) times daily.  . meloxicam (MOBIC) 15 MG tablet Take 7.5 mg by mouth 2 (two) times daily.  . [DISCONTINUED] amoxicillin-clavulanate (AUGMENTIN) 875-125 MG per tablet Take 1 tablet by mouth 2 (two) times daily.  . [DISCONTINUED] Aspirin-Acetaminophen-Caffeine (GOODY HEADACHE PO) Take 1 packet by mouth 2 (two) times daily as needed (for pain).  . [DISCONTINUED] citalopram (CELEXA) 20 MG tablet Take 1 tablet (20 mg total) by mouth daily.  . [DISCONTINUED] insulin glargine (LANTUS) 100 UNIT/ML injection Inject 0.3 mLs (30 Units total) into the skin 2 (two) times daily.  . [  DISCONTINUED] insulin regular (NOVOLIN R,HUMULIN R) 100 units/mL injection Inject 2-40 Units into the skin 3 (three) times daily before meals. AS DIRECTED PER AGGRESSIVE SLIDING SCALE INSTRUCTIONS   No facility-administered encounter medications on file as of 03/30/2018.     ALLERGIES: No Known Allergies  VACCINATION STATUS:  There is no immunization history on file for this patient.  Diabetes  She presents for her initial diabetic visit. She has type 1 diabetes mellitus. Onset time: He was diagnosed at approximate age of 18 years. Her disease course has been fluctuating. Hypoglycemia symptoms include confusion and nervousness/anxiousness. Pertinent negatives for hypoglycemia include no headaches, pallor, seizures or sweats. Associated symptoms include polydipsia and polyuria. Pertinent negatives for diabetes include no chest pain, no fatigue, no polyphagia and no weakness. There are no hypoglycemic complications. Symptoms are worsening. Diabetic complications include peripheral neuropathy. Risk factors for coronary artery  disease include diabetes mellitus, male sex, tobacco exposure and sedentary lifestyle. Current diabetic treatments: He is currently on Levemir 30 units a.m., 20 units p.m., Humalog up to 40 units daily. Her weight is increasing steadily. She is following a generally unhealthy diet. When asked about meal planning, she reported none. She has not had a previous visit with a dietitian. She never participates in exercise. Her home blood glucose trend is fluctuating minimally. Her overall blood glucose range is 180-200 mg/dl. (He brought in meter showing average blood glucose of 180-200, monitoring randomly.) An ACE inhibitor/angiotensin II receptor blocker is not being taken. She does not see a podiatrist.Eye exam is not current.  Hypertension  This is a new (He was found to have elevated blood pressure 142/84 today's afternoon.  He does not know if he was ever diagnosed with hypertension.) problem. The current episode started today. Pertinent negatives include no chest pain, headaches, neck pain, palpitations, shortness of breath or sweats. Risk factors for coronary artery disease include smoking/tobacco exposure, sedentary lifestyle, male gender, diabetes mellitus and dyslipidemia. Past treatments include nothing.  Hyperlipidemia  This is a chronic problem. Exacerbating diseases include diabetes. Pertinent negatives include no chest pain, myalgias or shortness of breath. He is currently on no antihyperlipidemic treatment. Risk factors for coronary artery disease include diabetes mellitus, dyslipidemia, male sex, family history and a sedentary lifestyle.     Review of Systems  Constitutional: Negative for chills, fatigue, fever and unexpected weight change.  HENT: Negative for dental problem, mouth sores, trouble swallowing and voice change.   Eyes: Negative for visual disturbance.  Respiratory: Negative for cough, choking, chest tightness, shortness of breath and wheezing.   Cardiovascular: Negative for  chest pain, palpitations and leg swelling.  Gastrointestinal: Negative for abdominal distention, abdominal pain, constipation, diarrhea, nausea and vomiting.  Endocrine: Positive for polydipsia and polyuria. Negative for cold intolerance, heat intolerance and polyphagia.  Genitourinary: Negative for dysuria, flank pain, hematuria and urgency.  Musculoskeletal: Negative for arthralgias, back pain, gait problem, myalgias and neck pain.  Skin: Negative for color change, pallor, rash and wound.  Neurological: Negative for seizures, syncope, weakness, numbness and headaches.  Psychiatric/Behavioral: Positive for confusion. Negative for dysphoric mood and suicidal ideas. The patient is nervous/anxious.     Objective:    BP (!) 142/84   Pulse 79   Ht 6\' 1"  (1.854 m)   Wt 211 lb (95.7 kg)   BMI 27.84 kg/m   Wt Readings from Last 3 Encounters:  03/30/18 211 lb (95.7 kg)  09/15/15 210 lb (95.3 kg)  05/22/13 205 lb (93 kg)     Physical  Exam Constitutional:      Appearance: She is well-developed.  HENT:     Head: Normocephalic and atraumatic.     Mouth/Throat:     Comments: Poor dentition. Neck:     Musculoskeletal: Normal range of motion and neck supple.     Thyroid: No thyromegaly.     Trachea: No tracheal deviation.  Pulmonary:     Effort: Pulmonary effort is normal.  Musculoskeletal: Normal range of motion.  Skin:    General: Skin is warm and dry.  Neurological:     Mental Status: She is alert and oriented to person, place, and time.     Cranial Nerves: No cranial nerve deficit.     Deep Tendon Reflexes: Reflexes are normal and symmetric.  Psychiatric:     Comments: Patient is disheveled, conspicuously poor hygiene.  He is in dysphoric mood.     CMP     Component Value Date/Time   NA 139 11/05/2012 2001   K 4.3 11/05/2012 2001   CL 101 11/05/2012 2001   CO2 30 11/05/2012 2001   GLUCOSE 207 (H) 11/05/2012 2001   BUN 11 11/05/2012 2001   CREATININE 0.85 11/05/2012 2001    CALCIUM 9.5 11/05/2012 2001   PROT 7.2 08/01/2011 2307   ALBUMIN 4.2 08/01/2011 2307   AST 23 08/01/2011 2307   ALT 40 08/01/2011 2307   ALKPHOS 126 (H) 08/01/2011 2307   BILITOT 0.3 08/01/2011 2307   GFRNONAA >90 11/05/2012 2001   GFRAA >90 11/05/2012 2001    Assessment & Plan:   1. Uncontrolled type 1 diabetes mellitus with hyperglycemia (HCC)  - Justin Moreno has currently uncontrolled symptomatic type 1 DM since  53 years of age. -He comes in with a meter showing random glycemic profile averaging 1 80-200.  He does not have recent A1c to review.  -He will be sent for new set of labs including A1c, CMP, and thyroid function test. - I discussed with him about the nature of type 1 diabetes and the pathology behind its complications. -his diabetes is complicated by peripheral neuropathy, chronic heavy smoking and he remains at extremely  high risk for more acute and chronic complications which include CAD, CVA, CKD, retinopathy, and neuropathy. These are all discussed in detail with him.  - I have counseled him on diet management by adopting a carbohydrate restricted/protein rich diet.  - Suggestion is made for him to avoid simple carbohydrates  from his diet including Cakes, Sweet Desserts, Ice Cream, Soda (diet and regular), Sweet Tea, Candies, Chips, Cookies, Store Bought Juices, Alcohol in Excess of  1-2 drinks a day, Artificial Sweeteners,  Coffee Creamer, and "Sugar-free" Products. This will help patient to have more stable blood glucose profile and potentially avoid unintended weight gain.  - I encouraged him to switch to  unprocessed or minimally processed complex starch and increased protein intake (animal or plant source), fruits, and vegetables.  - he is advised to stick to a routine mealtimes to eat 3 meals  a day and avoid unnecessary snacks ( to snack only to correct hypoglycemia).   - he will be scheduled with Norm SaltPenny Crumpton, RDN, CDE for individualized diabetes  education.  - I have approached him with the following individualized plan to manage diabetes and patient agrees:   -He will continue to require intensive treatment with basal/bolus insulin in order for him to achieve and maintain control of diabetes to target. -In this patient with unpredictable and unstable meal times, priority would be  to avoid hypoglycemia. -I discussed and lowered his basal insulin Basaglar to 40 units nightly, readjust his prandial insulin Humalog to 10 units 3 times a day with meals  for pre-meal BG readings of 90-150mg /dl, plus patient specific correction dose for unexpected hyperglycemia above 150mg /dl, associated with strict monitoring of glucose 4 times a day-before meals and at bedtime. - he is warned not to take insulin without proper monitoring per orders. - Adjustment parameters are given to him for hypo and hyperglycemia in writing. - he is encouraged to call clinic for blood glucose levels less than 70 or above 300 mg /dl. -He is not a candidate for metformin, SGLT2 inhibitors, nor incretin therapy.  Proper use of insulin this is exclusive regimen to treat his diabetes.  - Patient specific target  A1c;  LDL, HDL, Triglycerides, and  Waist Circumference were discussed in detail.  2) Blood Pressure /Hypertension:  his blood pressure is elevated at 142/84 today.  He is not on any antihypertensive medications.  Will be considered for low-dose lisinopril on subsequent visit.   3) Lipids/Hyperlipidemia: No recent lipid panel to review.  He is not on statins.    4)  Weight/Diet:  Body mass index is 27.84 kg/m.  Weight loss in this patient is not advised.   CDE Consult will be initiated . Exercise, and detailed carbohydrates information provided  -  detailed on discharge instructions.  5) Chronic Care/Health Maintenance:  -he  Is not  on ACEI/ARB and Statin medications  (be considered on subsequent visits) and  is encouraged to initiate and continue to follow up with  Ophthalmology, Dentist,  Podiatrist at least yearly or according to recommendations, and advised to  quit smoking. I have recommended yearly flu vaccine and pneumonia vaccine at least every 5 years; moderate intensity exercise for up to 150 minutes weekly; and  sleep for at least 7 hours a day.  -He is counseled extensively for smoking cessation.  - he is  advised to maintain close follow up with Justin PacVaughn, Jason C, NP for primary care needs, as well as his other providers for optimal and coordinated care.  - Time spent with the patient: 35 minutes, of which >50% was spent in obtaining information about his symptoms, reviewing his previous labs, evaluations, and treatments, counseling him about his chronically uncontrolled type 1 diabetes, hyperlipidemia, and developing plans for long term treatment based on the latest recommendations.  Justin Moreno participated in the discussions, expressed understanding, and voiced agreement , and reluctantly accepts this plan.   All questions were answered to his satisfaction. he is encouraged to contact clinic should he have any questions or concerns prior to his return visit.  Follow up plan: - Return in about 10 days (around 04/09/2018) for Meter, and Logs, Labs Today- Non-Fasting Ok.  Marquis LunchGebre Jaedah Lords, MD [facility] [address] [place], [place] [address] Phone: [phone]  Fax: [phone]    03/30/2018, 4:58 PM  This note was partially dictated with voice recognition software. Similar sounding words can be transcribed inadequately or may not  be corrected upon review.

## 2018-03-30 NOTE — Patient Instructions (Signed)

## 2018-04-13 ENCOUNTER — Ambulatory Visit: Payer: Self-pay | Admitting: "Endocrinology

## 2018-04-27 ENCOUNTER — Ambulatory Visit: Payer: Self-pay | Admitting: "Endocrinology

## 2021-11-15 ENCOUNTER — Other Ambulatory Visit: Payer: Self-pay

## 2021-11-15 ENCOUNTER — Encounter (HOSPITAL_COMMUNITY): Payer: Self-pay | Admitting: Emergency Medicine

## 2021-11-15 ENCOUNTER — Emergency Department (HOSPITAL_COMMUNITY)
Admission: EM | Admit: 2021-11-15 | Discharge: 2021-11-15 | Disposition: A | Discharge status: Home / Self Care | Payer: Self-pay | Attending: Emergency Medicine | Admitting: Emergency Medicine

## 2021-11-15 DIAGNOSIS — E109 Type 1 diabetes mellitus without complications: Secondary | ICD-10-CM | POA: Insufficient documentation

## 2021-11-15 DIAGNOSIS — F1910 Other psychoactive substance abuse, uncomplicated: Secondary | ICD-10-CM | POA: Insufficient documentation

## 2021-11-15 DIAGNOSIS — R4589 Other symptoms and signs involving emotional state: Secondary | ICD-10-CM

## 2021-11-15 DIAGNOSIS — Z794 Long term (current) use of insulin: Secondary | ICD-10-CM | POA: Insufficient documentation

## 2021-11-15 DIAGNOSIS — F1110 Opioid abuse, uncomplicated: Secondary | ICD-10-CM

## 2021-11-15 DIAGNOSIS — F17210 Nicotine dependence, cigarettes, uncomplicated: Secondary | ICD-10-CM | POA: Insufficient documentation

## 2021-11-15 DIAGNOSIS — R45851 Suicidal ideations: Secondary | ICD-10-CM | POA: Insufficient documentation

## 2021-11-15 DIAGNOSIS — Z20822 Contact with and (suspected) exposure to covid-19: Secondary | ICD-10-CM | POA: Insufficient documentation

## 2021-11-15 LAB — CBC
HCT: 44.5 % (ref 39.0–52.0)
Hemoglobin: 15.8 g/dL (ref 13.0–17.0)
MCH: 29.3 pg (ref 26.0–34.0)
MCHC: 35.5 g/dL (ref 30.0–36.0)
MCV: 82.6 fL (ref 80.0–100.0)
Platelets: 189 10*3/uL (ref 150–400)
RBC: 5.39 MIL/uL (ref 4.22–5.81)
RDW: 13.3 % (ref 11.5–15.5)
WBC: 6.5 10*3/uL (ref 4.0–10.5)
nRBC: 0 % (ref 0.0–0.2)

## 2021-11-15 LAB — COMPREHENSIVE METABOLIC PANEL
ALT: 19 U/L (ref 0–44)
AST: 23 U/L (ref 15–41)
Albumin: 4.7 g/dL (ref 3.5–5.0)
Alkaline Phosphatase: 86 U/L (ref 38–126)
Anion gap: 10 (ref 5–15)
BUN: 15 mg/dL (ref 6–20)
CO2: 28 mmol/L (ref 22–32)
Calcium: 9.5 mg/dL (ref 8.9–10.3)
Chloride: 102 mmol/L (ref 98–111)
Creatinine, Ser: 0.9 mg/dL (ref 0.61–1.24)
GFR, Estimated: >60 mL/min (ref >60)
Glucose, Bld: 116 mg/dL — ABNORMAL HIGH (ref 70–99)
Potassium: 3.4 mmol/L — ABNORMAL LOW (ref 3.5–5.1)
Sodium: 140 mmol/L (ref 135–145)
Total Bilirubin: 1.3 mg/dL — ABNORMAL HIGH (ref 0.3–1.2)
Total Protein: 7.4 g/dL (ref 6.5–8.1)

## 2021-11-15 LAB — RAPID URINE DRUG SCREEN, HOSP PERFORMED
Amphetamines: NOT DETECTED
Barbiturates: NOT DETECTED
Benzodiazepines: NOT DETECTED
Cocaine: NOT DETECTED
Opiates: NOT DETECTED
Tetrahydrocannabinol: NOT DETECTED

## 2021-11-15 LAB — ACETAMINOPHEN LEVEL: Acetaminophen (Tylenol), Serum: <10 ug/mL — ABNORMAL LOW (ref 10–30)

## 2021-11-15 LAB — CBG MONITORING, ED
Glucose-Capillary: 153 mg/dL — ABNORMAL HIGH (ref 70–99)
Glucose-Capillary: 260 mg/dL — ABNORMAL HIGH (ref 70–99)

## 2021-11-15 LAB — RESP PANEL BY RT-PCR (FLU A&B, COVID) ARPGX2
Influenza A by PCR: NEGATIVE
Influenza B by PCR: NEGATIVE
SARS Coronavirus 2 by RT PCR: NEGATIVE

## 2021-11-15 LAB — SALICYLATE LEVEL: Salicylate Lvl: <7 mg/dL — ABNORMAL LOW (ref 7.0–30.0)

## 2021-11-15 LAB — HEMOGLOBIN A1C
Hgb A1c MFr Bld: 7.3 % — ABNORMAL HIGH (ref 4.8–5.6)
Mean Plasma Glucose: 162.81 mg/dL

## 2021-11-15 LAB — ETHANOL: Alcohol, Ethyl (B): <10 mg/dL (ref <10)

## 2021-11-15 MED ORDER — INSULIN ASPART 100 UNIT/ML IJ SOLN
0.0000 [IU] | Freq: Three times a day (TID) | INTRAMUSCULAR | Status: DC
  Administered 2021-11-15: 2 [IU] via SUBCUTANEOUS
  Administered 2021-11-15: 5 [IU] via SUBCUTANEOUS
  Filled 2021-11-15 (×2): qty 1

## 2021-11-15 MED ORDER — CLONIDINE HCL 0.1 MG PO TABS: 0.1000 mg | ORAL_TABLET | Freq: Three times a day (TID) | ORAL | Status: DC | PRN

## 2021-11-15 MED ORDER — LOPERAMIDE HCL 2 MG PO CAPS: 2.0000 mg | ORAL_CAPSULE | ORAL | Status: DC | PRN

## 2021-11-15 MED ORDER — ONDANSETRON 8 MG PO TBDP: 8.0000 mg | ORAL_TABLET | Freq: Three times a day (TID) | ORAL | Status: DC | PRN

## 2021-11-15 MED ORDER — INSULIN DETEMIR 100 UNIT/ML ~~LOC~~ SOLN: 15.0000 [IU] | Freq: Every day | SUBCUTANEOUS | 11 refills | Status: AC

## 2021-11-15 MED ORDER — INSULIN DETEMIR 100 UNIT/ML ~~LOC~~ SOLN
15.0000 [IU] | Freq: Every day | SUBCUTANEOUS | Status: DC
  Filled 2021-11-15: qty 0.15

## 2021-11-15 NOTE — ED Notes (Signed)
Patient alert and cooperative, TTS assessment imitated.

## 2021-11-15 NOTE — ED Provider Notes (Signed)
[facility] EMERGENCY DEPARTMENT Provider Note   CSN: [id number] Arrival date & time: 11/15/21  7829     History  Chief Complaint  Patient presents with   V70.1    Justin Moreno is a 56 y.o. male.  56 year old male with past medical history of opiate addiction the presents the ER today with worsening thoughts of suicide with a plan of jumping off a bridge or hanging himself.  Patient states that he wants to quit opiates as well.  Patient states that his life is just going to ruin over the last couple months and his last week has been really tough with multiple thoughts of suicide with multiple plans.  He also states he is a brittle diabetic and has trouble with getting his medications as he has no insurance or job.  No medical complaints at this time.        Home Medications Prior to Admission medications   Medication Sig Start Date End Date Taking? Authorizing Provider  gabapentin (NEURONTIN) 300 MG capsule Take 600 mg by mouth every morning.    [provider]  HUMALOG KWIKPEN 100 UNIT/ML KwikPen Inject 10-16 Units into the skin 3 (three) times daily before meals. 01/18/18   [provider]  Insulin Glargine (BASAGLAR KWIKPEN) 100 UNIT/ML SOPN 30 Units 2 (two) times daily. 01/18/18   [provider]  meloxicam (MOBIC) 15 MG tablet Take 7.5 mg by mouth 2 (two) times daily. 01/18/18   [provider]      Allergies    Patient has no known allergies.    Review of Systems   Review of Systems  Physical Exam Updated Vital Signs BP 124/88 (BP Location: Right Arm)   Pulse 77   Temp 97.7 F (36.5 C) (Oral)   Resp 18   Ht 6\' 1"  (1.854 m)   Wt 83.9 kg   SpO2 98%   BMI 24.41 kg/m  Physical Exam Vitals and nursing note reviewed.  Constitutional:      Appearance: He is well-developed.  HENT:     Head: Normocephalic and atraumatic.     Mouth/Throat:     Mouth: Mucous membranes are moist.     Pharynx: Oropharynx is clear.  Eyes:      Pupils: Pupils are equal, round, and reactive to light.  Cardiovascular:     Rate and Rhythm: Normal rate.  Pulmonary:     Effort: Pulmonary effort is normal. No respiratory distress.  Abdominal:     General: Abdomen is flat. There is no distension.  Musculoskeletal:        General: Normal range of motion.     Cervical back: Normal range of motion.  Skin:    General: Skin is warm and dry.  Neurological:     General: No focal deficit present.     Mental Status: He is alert.  Psychiatric:     Comments: Very despondent with suicidal thoughts and a plan.     ED Results / Procedures / Treatments   Labs (all labs ordered are listed, but only abnormal results are displayed) Labs Reviewed  COMPREHENSIVE METABOLIC PANEL - Abnormal; Notable for the following components:      Result Value   Potassium 3.4 (*)    Glucose, Bld 116 (*)    Total Bilirubin 1.3 (*)    All other components within normal limits  SALICYLATE LEVEL - Abnormal; Notable for the following components:   Salicylate Lvl <7.0 (*)    All other components within  normal limits  ACETAMINOPHEN LEVEL - Abnormal; Notable for the following components:   Acetaminophen (Tylenol), Serum <10 (*)    All other components within normal limits  RESP PANEL BY RT-PCR (FLU A&B, COVID) ARPGX2  ETHANOL  CBC  RAPID URINE DRUG SCREEN, HOSP PERFORMED  HEMOGLOBIN A1C    EKG None  Radiology No results found.  Procedures Procedures    Medications Ordered in ED Medications  cloNIDine (CATAPRES) tablet 0.1 mg (has no administration in time range)  ondansetron (ZOFRAN-ODT) disintegrating tablet 8 mg (has no administration in time range)  loperamide (IMODIUM) capsule 2 mg (has no administration in time range)  insulin detemir (LEVEMIR) injection 15 Units (has no administration in time range)  insulin aspart (novoLOG) injection 0-9 Units (has no administration in time range)    ED Course/ Medical Decision Making/ A&P                            Medical Decision Making Amount and/or Complexity of Data Reviewed Labs: ordered.  Risk Prescription drug management.   Meds ordered including diabetes management.  Labs here are reassuring.  Meds ordered for PRN withdrawal. Patient is medically clear for TTS consultation.  He is here voluntarily at this time.   Final Clinical Impression(s) / ED Diagnoses Final diagnoses:  Suicidal behavior without attempted self-injury    Rx / DC Orders ED Discharge Orders     None         Rissa Turley, Barbara Cower, MD 11/15/21 [phone]

## 2021-11-15 NOTE — ED Notes (Signed)
Pt wanded by security. 

## 2021-11-15 NOTE — TOC Progression Note (Addendum)
Transition of Care [facility]) - Progression Note    Patient Details  Name: Justin Moreno MRN: [id number] Date of Birth: [birth year]  Transition of Care [facility]) CM/SW Contact  Leitha Bleak, RN Phone Number: 11/15/2021, 2:50 PM  Clinical Narrative:   [facility] consulted for SA abuse resources. Resource handout printed in ED for RN to give to patient, Unable to reach patient. Left message, explaining the resources and gave information on walking in clinic, [facility].    RN states, patient wants to talk with MD. Resources given and others added to AVS.     Expected Discharge Plan: Home/Self Care Barriers to Discharge: No Barriers Identified  Expected Discharge Plan and Services Expected Discharge Plan: Home/Self Care

## 2021-11-15 NOTE — Consult Note (Signed)
Telepsych Consultation   Reason for Consult:  psych consult Referring Physician:  Marily Memos, MD Location of Patient:  [facility] Location of Provider: Behavioral Health TTS Department  Patient Identification: Justin Moreno MRN:  [id number] Principal Diagnosis: Substance abuse [facility]) Diagnosis:  Principal Problem:   Substance abuse (HCC)   Total Time spent with patient: 20 minutes  Subjective:   Justin Moreno is a 56 y.o. male patient admitted with reported suicidal behavior.  Patient presents calm and cooperative, alert and oriented. Even mood and affect. Complains of being "tired" after allegedly walking to hospital for 5 hours from [place]. Reports using "opiate drugs" (Fentanyl) and Subutex, reports last use over 2 months ago. Denies any affiliation with MAT or OPT treatment centers. Has been living with family in [place], [place]. Currently unemployed x3 years. Requesting drug treatment. Reports history of substance abuse treatment in [facility] in 2010 and in detox facility in [place] for 6 days in August 2023. He denies any thoughts of wanting to harm himself/others and does not present actively psychotic or under the influence of any substances; UDS -, BAL<10.   HPI:  Justin Moreno is a 56 year old male with past psychiatric history of substance abuse who presented to [facility] with complaint of passive suicidal ideations and opiate addiction. Per chart review, multiple ED encounters related to DM; last psychiatric presentation 11/05/2012 for narcotic abuse. He reports recent detox encounter in [place] for 6 days; discharged with 4 day supply of Suboxone. No other follow-up noted. Inconsistent historian. UDS-, BAL<10. PDMP reviewed, Buprenorphine-Naxalone 4-1 mg film 6 quantity filled 10/10/21.   Past Psychiatric History: substance abuse  Risk to Self:  pt denies Risk to Others:  pt denies Prior Inpatient Therapy:  pt denies Prior Outpatient Therapy:  yes  Past Medical History:   Past Medical History:  Diagnosis Date   Depression    Hyperlipidemia    Juvenile diabetes    x 30 years   History reviewed. No pertinent surgical history. Family History:  Family History  Problem Relation Age of Onset   Diabetes Father    Family Psychiatric  History: not noted Social History:  Social History   Substance and Sexual Activity  Alcohol Use No     Social History   Substance and Sexual Activity  Drug Use No   Comment: narcotic pain medications-last use 8/26 @ 2230    Social History   Socioeconomic History   Marital status: Single    Spouse name: Not on file   Number of children: Not on file   Years of education: Not on file   Highest education level: Not on file  Occupational History   Not on file  Tobacco Use   Smoking status: Every Day    Packs/day: 0.50    Years: 23.00    Total pack years: 11.50    Types: Cigarettes   Smokeless tobacco: Never  Vaping Use   Vaping Use: Never used  Substance and Sexual Activity   Alcohol use: No   Drug use: No    Comment: narcotic pain medications-last use 8/26 @ 2230   Sexual activity: Not on file  Other Topics Concern   Not on file  Social History Narrative   Not on file   Social Determinants of Health   Financial Resource Strain: Not on file  Food Insecurity: Not on file  Transportation Needs: Not on file  Physical Activity: Not on file  Stress: Not on file  Social Connections:  Not on file   Additional Social History:    Allergies:  No Known Allergies  Labs:  Results for orders placed or performed during the hospital encounter of 11/15/21 (from the past 48 hour(s))  Rapid urine drug screen (hospital performed)     Status: None   Collection Time: 11/15/21  5:35 AM  Result Value Ref Range   Opiates NONE DETECTED NONE DETECTED   Cocaine NONE DETECTED NONE DETECTED   Benzodiazepines NONE DETECTED NONE DETECTED   Amphetamines NONE DETECTED NONE DETECTED   Tetrahydrocannabinol NONE DETECTED NONE  DETECTED   Barbiturates NONE DETECTED NONE DETECTED    Comment: (NOTE) DRUG SCREEN FOR MEDICAL PURPOSES ONLY.  IF CONFIRMATION IS NEEDED FOR ANY PURPOSE, NOTIFY LAB WITHIN 5 DAYS.  LOWEST DETECTABLE LIMITS FOR URINE DRUG SCREEN Drug Class                     Cutoff (ng/mL) Amphetamine and metabolites    1000 Barbiturate and metabolites    200 Benzodiazepine                 200 Tricyclics and metabolites     300 Opiates and metabolites        300 Cocaine and metabolites        300 THC                            50 Performed at [facility], [address] [place], [place] [address]   Comprehensive metabolic panel     Status: Abnormal   Collection Time: 11/15/21  5:51 AM  Result Value Ref Range   Sodium 140 135 - 145 mmol/L   Potassium 3.4 (L) 3.5 - 5.1 mmol/L   Chloride 102 98 - 111 mmol/L   CO2 28 22 - 32 mmol/L   Glucose, Bld 116 (H) 70 - 99 mg/dL    Comment: Glucose reference range applies only to samples taken after fasting for at least 8 hours.   BUN 15 6 - 20 mg/dL   Creatinine, Ser 5.17 0.61 - 1.24 mg/dL   Calcium 9.5 8.9 - 61.6 mg/dL   Total Protein 7.4 6.5 - 8.1 g/dL   Albumin 4.7 3.5 - 5.0 g/dL   AST 23 15 - 41 U/L   ALT 19 0 - 44 U/L   Alkaline Phosphatase 86 38 - 126 U/L   Total Bilirubin 1.3 (H) 0.3 - 1.2 mg/dL   GFR, Estimated >07 >37 mL/min    Comment: (NOTE) Calculated using the CKD-EPI Creatinine Equation (2021)    Anion gap 10 5 - 15    Comment: Performed at [facility], [address], [place], [place] [address]  Ethanol     Status: None   Collection Time: 11/15/21  5:51 AM  Result Value Ref Range   Alcohol, Ethyl (B) <10 <10 mg/dL    Comment: (NOTE) Lowest detectable limit for serum alcohol is 10 mg/dL.  For medical purposes only. Performed at [facility], [address] [place], [place] [address]   Salicylate level     Status: Abnormal   Collection Time: 11/15/21  5:51 AM  Result Value Ref Range   Salicylate Lvl <7.0 (L) 7.0 - 30.0 mg/dL     Comment: Performed at [facility], [address], [place], [place] [address]  Acetaminophen level     Status: Abnormal   Collection Time: 11/15/21  5:51 AM  Result Value Ref Range  Acetaminophen (Tylenol), Serum <10 (L) 10 - 30 ug/mL    Comment: (NOTE) Therapeutic concentrations vary significantly. A range of 10-30 ug/mL  may be an effective concentration for many patients. However, some  are best treated at concentrations outside of this range. Acetaminophen concentrations >150 ug/mL at 4 hours after ingestion  and >50 ug/mL at 12 hours after ingestion are often associated with  toxic reactions.  Performed at [facility], [address] [place], [place] [address]   cbc     Status: None   Collection Time: 11/15/21  5:51 AM  Result Value Ref Range   WBC 6.5 4.0 - 10.5 K/uL   RBC 5.39 4.22 - 5.81 MIL/uL   Hemoglobin 15.8 13.0 - 17.0 g/dL   HCT 19.144.5 47.839.0 - 29.552.0 %   MCV 82.6 80.0 - 100.0 fL   MCH 29.3 26.0 - 34.0 pg   MCHC 35.5 30.0 - 36.0 g/dL   RDW 62.113.3 30.811.5 - 65.715.5 %   Platelets 189 150 - 400 K/uL   nRBC 0.0 0.0 - 0.2 %    Comment: Performed at [facility], [address] [place], [place] [address]  Hemoglobin A1c     Status: Abnormal   Collection Time: 11/15/21  6:19 AM  Result Value Ref Range   Hgb A1c MFr Bld 7.3 (H) 4.8 - 5.6 %    Comment: (NOTE) Pre diabetes:          5.7%-6.4%  Diabetes:              >6.4%  Glycemic control for   <7.0% adults with diabetes    Mean Plasma Glucose 162.81 mg/dL    Comment: Performed at [facility], [address] [place], [place] [address]  Resp Panel by RT-PCR (Flu A&B, Covid) Anterior Nasal Swab     Status: None   Collection Time: 11/15/21  6:42 AM   Specimen: Anterior Nasal Swab  Result Value Ref Range   SARS Coronavirus 2 by RT PCR NEGATIVE NEGATIVE    Comment: (NOTE) SARS-CoV-2 target nucleic acids are NOT DETECTED.  The SARS-CoV-2 RNA is generally detectable in upper respiratory specimens during the acute  phase of infection. The lowest concentration of SARS-CoV-2 viral copies this assay can detect is 138 copies/mL. A negative result does not preclude SARS-Cov-2 infection and should not be used as the sole basis for treatment or other patient management decisions. A negative result may occur with  improper specimen collection/handling, submission of specimen other than nasopharyngeal swab, presence of viral mutation(s) within the areas targeted by this assay, and inadequate number of viral copies(<138 copies/mL). A negative result must be combined with clinical observations, patient history, and epidemiological information. The expected result is Negative.  Fact Sheet for Patients:  BloggerCourse.comhttps://www.fda.gov/media/152166/download  Fact Sheet for Healthcare Providers:  SeriousBroker.ithttps://www.fda.gov/media/152162/download  This test is no t yet approved or cleared by the [place] FDA and  has been authorized for detection and/or diagnosis of SARS-CoV-2 by FDA under an Emergency Use Authorization (EUA). This EUA will remain  in effect (meaning this test can be used) for the duration of the COVID-19 declaration under Section 564(b)(1) of the Act, 21 U.S.C.section 360bbb-3(b)(1), unless the authorization is terminated  or revoked sooner.       Influenza A by PCR NEGATIVE NEGATIVE   Influenza B by PCR NEGATIVE NEGATIVE    Comment: (NOTE) The Xpert Xpress SARS-CoV-2/FLU/RSV plus assay is intended as an aid in the diagnosis of influenza from Nasopharyngeal swab specimens  and should not be used as a sole basis for treatment. Nasal washings and aspirates are unacceptable for Xpert Xpress SARS-CoV-2/FLU/RSV testing.  Fact Sheet for Patients: BloggerCourse.com  Fact Sheet for Healthcare Providers: SeriousBroker.it  This test is not yet approved or cleared by the [place] FDA and has been authorized for detection and/or diagnosis of SARS-CoV-2  by FDA under an Emergency Use Authorization (EUA). This EUA will remain in effect (meaning this test can be used) for the duration of the COVID-19 declaration under Section 564(b)(1) of the Act, 21 U.S.C. section 360bbb-3(b)(1), unless the authorization is terminated or revoked.  Performed at [facility], [address] [place], [place] [address]   CBG monitoring, ED     Status: Abnormal   Collection Time: 11/15/21  7:44 AM  Result Value Ref Range   Glucose-Capillary 153 (H) 70 - 99 mg/dL    Comment: Glucose reference range applies only to samples taken after fasting for at least 8 hours.  CBG monitoring, ED     Status: Abnormal   Collection Time: 11/15/21 12:01 PM  Result Value Ref Range   Glucose-Capillary 260 (H) 70 - 99 mg/dL    Comment: Glucose reference range applies only to samples taken after fasting for at least 8 hours.    Medications:  Current Facility-Administered Medications  Medication Dose Route Frequency Provider Last Rate Last Admin   cloNIDine (CATAPRES) tablet 0.1 mg  0.1 mg Oral TID PRN Mesner, Barbara Cower, MD       insulin aspart (novoLOG) injection 0-9 Units  0-9 Units Subcutaneous TID WC Mesner, Barbara Cower, MD   5 Units at 11/15/21 1217   insulin detemir (LEVEMIR) injection 15 Units  15 Units Subcutaneous QHS Mesner, Barbara Cower, MD       loperamide (IMODIUM) capsule 2 mg  2 mg Oral PRN Mesner, Barbara Cower, MD       ondansetron (ZOFRAN-ODT) disintegrating tablet 8 mg  8 mg Oral Q8H PRN Mesner, Barbara Cower, MD       Current Outpatient Medications  Medication Sig Dispense Refill   insulin NPH-regular Human (70-30) 100 UNIT/ML injection Inject into the skin.     gabapentin (NEURONTIN) 300 MG capsule Take 600 mg by mouth every morning.     HUMALOG KWIKPEN 100 UNIT/ML KwikPen Inject 10-16 Units into the skin 3 (three) times daily before meals.  5   Insulin Glargine (BASAGLAR KWIKPEN) 100 UNIT/ML SOPN 30 Units 2 (two) times daily.  5   meloxicam (MOBIC) 15 MG tablet Take 7.5 mg by mouth 2  (two) times daily.  5    Musculoskeletal: Strength & Muscle Tone: within normal limits Gait & Station: normal Patient leans: N/A  Psychiatric Specialty Exam:  Presentation  General Appearance: Casual Eye Contact:Fair Speech:Clear and Coherent Speech Volume:Normal Handedness:No data recorded  Mood and Affect  Mood:Euthymic Affect:Congruent  Thought Process  Thought Processes:Goal Directed Descriptions of Associations:Intact  Orientation:Full (Time, Place and Person)  Thought Content:Logical  History of Schizophrenia/Schizoaffective disorder:No data recorded Duration of Psychotic Symptoms:No data recorded Hallucinations:Hallucinations: None  Ideas of Reference:None  Suicidal Thoughts:Suicidal Thoughts: No  Homicidal Thoughts:Homicidal Thoughts: No   Sensorium  Memory:Immediate Fair; Remote Fair; Recent Fair Judgment:Fair Insight:Fair; Present  Executive Functions  Concentration:Fair Attention Span:Fair Recall:Fair Fund of Knowledge:Fair Language:Fair  Psychomotor Activity  Psychomotor Activity:Psychomotor Activity: Normal  Assets  Assets:Physical Health; Resilience; Housing  Sleep  Sleep:Sleep: Fair   Physical Exam: Physical Exam Vitals and nursing note reviewed.  Psychiatric:        Attention and Perception: Attention  and perception normal. He does not perceive auditory or visual hallucinations.        Mood and Affect: Mood and affect normal.        Speech: Speech normal.        Behavior: Behavior normal. Behavior is cooperative.        Thought Content: Thought content is not paranoid or delusional. Thought content does not include homicidal or suicidal ideation. Thought content does not include homicidal or suicidal plan.        Cognition and Memory: Cognition and memory normal.        Judgment: Judgment normal.    Review of Systems  Psychiatric/Behavioral:  Positive for substance abuse. Negative for depression, memory loss and suicidal  ideas. The patient is not nervous/anxious.   All other systems reviewed and are negative.  Blood pressure 124/88, pulse 77, temperature 97.7 F (36.5 C), temperature source Oral, resp. rate 18, height 6\' 1"  (1.854 m), weight 83.9 kg, SpO2 98 %. Body mass index is 24.41 kg/m.  Treatment Plan Summary: Plan Patient currently does not meet inpatient psychiatric criteria as he denies any thoughts of wanting to himself or anyone and does not appear actively psychotic or under the influence of any substances.   Disposition: No evidence of imminent risk to self or others at present.   Patient does not meet criteria for psychiatric inpatient admission. Supportive therapy provided about ongoing stressors. Discussed crisis plan, support from social network, calling 911, coming to the Emergency Department, and calling Suicide Hotline. TOC/SW consult placed to address psychosocial needs and substance abuse resources.   This service was provided via telemedicine using a 2-way, interactive audio and video technology.  Names of all persons participating in this telemedicine service and their role in this encounter. Name: Role: PMHNP  Name: Maxie Barb Role: Attending MD  Name: Nelly Rout  Role: patient  Name:  Role:     Eldred Manges, NP 11/15/2021 12:21 PM

## 2021-11-15 NOTE — ED Notes (Signed)
TTS assessment completed. 

## 2021-11-15 NOTE — ED Triage Notes (Signed)
Pt here with c/o opiate addiction. States he is having anxiety and depression and that he "has been having bad thoughts this week". When asked to elaborate, pt states "not wanting to live".

## 2021-11-15 NOTE — ED Notes (Signed)
Attempted to discharge patient home. Patient states he does not feel ready to go home. Particia Nearing, MD made aware that patient would like to talk to her again.

## 2021-11-15 NOTE — Inpatient Diabetes Management (Signed)
Inpatient Diabetes Program Recommendations  AACE/ADA: New Consensus Statement on Inpatient Glycemic Control (2015)  Target Ranges:  Prepandial:   less than 140 mg/dL      Peak postprandial:   less than 180 mg/dL (1-2 hours)      Critically ill patients:  140 - 180 mg/dL    Latest Reference Range & Units 11/15/21 07:44  Glucose-Capillary 70 - 99 mg/dL 938 (H)  2 units Novolog   (H): Data is abnormally high   To ED with Suicidal Thoughts  History: Type 1 Diabetes  Home DM Meds: Humalog 10-16 units TID with meals        Basaglar 30 units BID  Current Orders: Levemir 15 units QHS      Novolog Sensitive Correction Scale/ SSI (0-9 units) TID AC     Note orders placed for Levemir and Novolog  Agree  Will follow and assist   --Will follow patient during hospitalization--  Ambrose Finland RN, MSN, CDCES Diabetes Coordinator Inpatient Glycemic Control Team Team Pager: [phone] (8a-5p)

## 2021-11-15 NOTE — ED Provider Notes (Signed)
Pt has been psych cleared.  He is stable for d/c.  He is a diabetic and does not have his insulin, so he is d/c with a rx for Levemir.  He is to return if worse.  F/u with pcp.   Jacalyn Lefevre, MD 11/15/21 1311

## 2022-08-24 ENCOUNTER — Other Ambulatory Visit: Payer: Self-pay

## 2022-08-24 DIAGNOSIS — M25512 Pain in left shoulder: Secondary | ICD-10-CM

## 2022-09-01 ENCOUNTER — Other Ambulatory Visit: Payer: Self-pay | Admitting: Orthopedic Surgery

## 2022-09-01 DIAGNOSIS — M25512 Pain in left shoulder: Secondary | ICD-10-CM
